# Patient Record
Sex: Male | Born: 1950 | Race: Black or African American | Hispanic: No | Marital: Married | Smoking: Former smoker
Health system: Southern US, Community
[De-identification: ages and names within clinical notes are randomized; demographics above are authoritative.]

## PROBLEM LIST (undated history)

## (undated) DIAGNOSIS — Z973 Presence of spectacles and contact lenses: Secondary | ICD-10-CM

## (undated) DIAGNOSIS — R972 Elevated prostate specific antigen [PSA]: Secondary | ICD-10-CM

## (undated) DIAGNOSIS — I10 Essential (primary) hypertension: Secondary | ICD-10-CM

## (undated) DIAGNOSIS — E119 Type 2 diabetes mellitus without complications: Secondary | ICD-10-CM

## (undated) DIAGNOSIS — M1990 Unspecified osteoarthritis, unspecified site: Secondary | ICD-10-CM

## (undated) DIAGNOSIS — C61 Malignant neoplasm of prostate: Secondary | ICD-10-CM

## (undated) DIAGNOSIS — N471 Phimosis: Secondary | ICD-10-CM

## (undated) DIAGNOSIS — G4730 Sleep apnea, unspecified: Secondary | ICD-10-CM

## (undated) HISTORY — PX: CYST EXCISION: SHX5701

---

## 2002-05-16 DIAGNOSIS — G4730 Sleep apnea, unspecified: Secondary | ICD-10-CM

## 2002-05-16 HISTORY — DX: Sleep apnea, unspecified: G47.30

## 2013-06-22 ENCOUNTER — Encounter (HOSPITAL_COMMUNITY): Payer: Self-pay | Admitting: Emergency Medicine

## 2013-06-22 ENCOUNTER — Emergency Department (HOSPITAL_COMMUNITY)
Admission: EM | Admit: 2013-06-22 | Discharge: 2013-06-22 | Disposition: A | Discharge status: Home / Self Care | Payer: BC Managed Care – PPO | Attending: Emergency Medicine | Admitting: Emergency Medicine

## 2013-06-22 ENCOUNTER — Emergency Department (HOSPITAL_COMMUNITY): Payer: BC Managed Care – PPO

## 2013-06-22 DIAGNOSIS — B9689 Other specified bacterial agents as the cause of diseases classified elsewhere: Secondary | ICD-10-CM | POA: Insufficient documentation

## 2013-06-22 DIAGNOSIS — A499 Bacterial infection, unspecified: Secondary | ICD-10-CM | POA: Insufficient documentation

## 2013-06-22 DIAGNOSIS — N454 Abscess of epididymis or testis: Secondary | ICD-10-CM | POA: Insufficient documentation

## 2013-06-22 DIAGNOSIS — E119 Type 2 diabetes mellitus without complications: Secondary | ICD-10-CM | POA: Insufficient documentation

## 2013-06-22 DIAGNOSIS — N451 Epididymitis: Secondary | ICD-10-CM

## 2013-06-22 DIAGNOSIS — F17200 Nicotine dependence, unspecified, uncomplicated: Secondary | ICD-10-CM | POA: Insufficient documentation

## 2013-06-22 DIAGNOSIS — I10 Essential (primary) hypertension: Secondary | ICD-10-CM | POA: Insufficient documentation

## 2013-06-22 HISTORY — DX: Type 2 diabetes mellitus without complications: E11.9

## 2013-06-22 HISTORY — DX: Essential (primary) hypertension: I10

## 2013-06-22 LAB — URINALYSIS, ROUTINE W REFLEX MICROSCOPIC
Bilirubin Urine: NEGATIVE
GLUCOSE, UA: NEGATIVE mg/dL
KETONES UR: NEGATIVE mg/dL
Nitrite: POSITIVE — AB
PROTEIN: NEGATIVE mg/dL
Specific Gravity, Urine: 1.023 (ref 1.005–1.030)
UROBILINOGEN UA: 1 mg/dL (ref 0.0–1.0)
pH: 5.5 (ref 5.0–8.0)

## 2013-06-22 LAB — URINE MICROSCOPIC-ADD ON

## 2013-06-22 MED ORDER — HYDROCODONE-ACETAMINOPHEN 5-325 MG PO TABS: 1.0000 | ORAL_TABLET | Freq: Four times a day (QID) | ORAL | Status: DC | PRN

## 2013-06-22 MED ORDER — SULFAMETHOXAZOLE-TRIMETHOPRIM 800-160 MG PO TABS: 1.0000 | ORAL_TABLET | Freq: Two times a day (BID) | ORAL | Status: DC

## 2013-06-22 NOTE — ED Notes (Signed)
Pt returned to room from radiology

## 2013-06-22 NOTE — ED Notes (Signed)
Pt in c/o left testicle swelling since December, states he recently moved to area and started a new job and today is the first time he has been able to come get it checked out, denies redness, c/o tenderness to palpation, denies urinary symptoms or penile discharge, states symptoms have been consistent but pain has gotten worse recently.

## 2013-06-22 NOTE — ED Notes (Signed)
Dr at bedside to update patient.

## 2013-06-22 NOTE — Discharge Instructions (Signed)
Epididymitis  Epididymitis is a swelling (inflammation) of the epididymis. The epididymis is a cord-like structure along the back part of the testicle. Epididymitis is usually, but not always, caused by infection. This is usually a sudden problem beginning with chills, fever and pain behind the scrotum and in the testicle. There may be swelling and redness of the testicle.  DIAGNOSIS   Physical examination will reveal a tender, swollen epididymis. Sometimes, cultures are obtained from the urine or from prostate secretions to help find out if there is an infection or if the cause is a different problem. Sometimes, blood work is performed to see if your white blood cell count is elevated and if a germ (bacterial) or viral infection is present. Using this knowledge, an appropriate medicine which kills germs (antibiotic) can be chosen by your caregiver. A viral infection causing epididymitis will most often go away (resolve) without treatment.  HOME CARE INSTRUCTIONS   · Hot sitz baths for 20 minutes, 4 times per day, may help relieve pain.  · Only take over-the-counter or prescription medicines for pain, discomfort or fever as directed by your caregiver.  · Take all medicines, including antibiotics, as directed. Take the antibiotics for the full prescribed length of time even if you are feeling better.  · It is very important to keep all follow-up appointments.  SEEK IMMEDIATE MEDICAL CARE IF:   · You have a fever.  · You have pain not relieved with medicines.  · You have any worsening of your problems.  · Your pain seems to come and go.  · You develop pain, redness, and swelling in the scrotum and surrounding areas.  MAKE SURE YOU:   · Understand these instructions.  · Will watch your condition.  · Will get help right away if you are not doing well or get worse.  Document Released: 04/29/2000 Document Revised: 07/25/2011 Document Reviewed: 03/19/2009  Exit[employer]® Patient Information ©2014 [employer].

## 2013-06-22 NOTE — ED Provider Notes (Signed)
CSN: [id number]     Arrival date & time 06/22/13  1148 History   First MD Initiated Contact with Patient 06/22/13 1152     Chief Complaint  Patient presents with  . Groin Swelling   (Consider location/radiation/quality/duration/timing/severity/associated sxs/prior Treatment) HPI Comments: L testicle pain and swelling, worsening, over past month. No trauma.  Patient is a 63 y.o. male presenting with testicular pain. The history is provided by the patient.  Testicle Pain This is a new problem. The current episode started more than 1 week ago. The problem occurs constantly. The problem has been gradually worsening. Pertinent negatives include no chest pain, no abdominal pain, no headaches and no shortness of breath. Nothing aggravates the symptoms. Nothing relieves the symptoms.    Past Medical History  Diagnosis Date  . Diabetes mellitus without complication   . Hypertension    History reviewed. No pertinent past surgical history. History reviewed. No pertinent family history. History  Substance Use Topics  . Smoking status: Current Every Day Smoker  . Smokeless tobacco: Not on file  . Alcohol Use: Not on file    Review of Systems  Constitutional: Negative for fever and chills.  Respiratory: Negative for shortness of breath.   Cardiovascular: Negative for chest pain.  Gastrointestinal: Negative for abdominal pain.  Genitourinary: Positive for testicular pain. Negative for dysuria, discharge, penile swelling, scrotal swelling, difficulty urinating and penile pain.  Neurological: Negative for headaches.  All other systems reviewed and are negative.    Allergies  Review of patient's allergies indicates no known allergies.  Home Medications  No current outpatient prescriptions on file. BP 156/87  Pulse 91  Temp(Src) 98.7 F (37.1 C) (Oral)  Resp 20  Wt 229 lb (103.874 kg)  SpO2 100% Physical Exam  Nursing note and vitals reviewed. Constitutional: He is oriented to  person, place, and time. He appears well-developed and well-nourished. No distress.  HENT:  Head: Normocephalic and atraumatic.  Mouth/Throat: No oropharyngeal exudate.  Eyes: EOM are normal. Pupils are equal, round, and reactive to light.  Neck: Normal range of motion. Neck supple.  Cardiovascular: Normal rate and regular rhythm.  Exam reveals no friction rub.   No murmur heard. Pulmonary/Chest: Effort normal and breath sounds normal. No respiratory distress. He has no wheezes. He has no rales.  Abdominal: He exhibits no distension. There is no tenderness. There is no rebound.  Genitourinary: Penis normal. Right testis shows no mass, no swelling and no tenderness. Right testis is descended. Cremasteric reflex is not absent on the right side. Left testis shows mass, swelling and tenderness (mild). Left testis is descended. Cremasteric reflex is absent on the left side. Uncircumcised. No penile erythema. No discharge found.  L testicle swollen, irregular contours. Swollen and tender.  Musculoskeletal: Normal range of motion. He exhibits no edema.  Neurological: He is alert and oriented to person, place, and time. No cranial nerve deficit. He exhibits normal muscle tone.  Skin: No rash noted. He is not diaphoretic.    ED Course  Procedures (including critical care time) Labs Review Labs Reviewed - No data to display Imaging Review No results found.  EKG Interpretation   None       MDM   1. Left epididymitis   2. Epididymal abscess    55M with one month of worsening L testicular swelling. Aching daily. Constant swelling, not intermittent. No dysuria, difficulty with urinating. No difficulty with bowel movements. Hx of DM, HTN. AFVSS here. L testicle hard, irregular contours. No hernia  appreciated. Concern for possible mass. Will [place]. I spoke with Radiology, patient has L sided epididymitis and likely epididymal abscess. Urology recommended 1 month course of antibiotics and f/u with  them in 1-2 weeks. Stable for discharge.  I have reviewed all labs and imaging and considered them in my medical decision making.   Osvaldo Shipper, MD 06/22/13 [phone]

## 2013-06-24 LAB — URINE CULTURE: Colony Count: >=100000

## 2014-06-23 ENCOUNTER — Other Ambulatory Visit: Payer: Self-pay | Admitting: Family Medicine

## 2014-06-23 ENCOUNTER — Ambulatory Visit
Admission: RE | Admit: 2014-06-23 | Discharge: 2014-06-23 | Disposition: A | Discharge status: Home / Self Care | Payer: BLUE CROSS/BLUE SHIELD | Source: Ambulatory Visit | Attending: Family Medicine | Admitting: Family Medicine

## 2014-06-23 DIAGNOSIS — N5089 Other specified disorders of the male genital organs: Secondary | ICD-10-CM

## 2014-06-23 DIAGNOSIS — N50812 Left testicular pain: Secondary | ICD-10-CM

## 2015-08-07 ENCOUNTER — Other Ambulatory Visit: Payer: Self-pay | Admitting: Urology

## 2015-08-14 NOTE — Anesthesia Preprocedure Evaluation (Addendum)
Anesthesia Evaluation  Patient identified by MRN, date of birth, ID band Patient awake    Reviewed: Allergy & Precautions, NPO status , Patient's Chart, lab work & pertinent test results  Airway Mallampati: II   Neck ROM: Full    Dental  (+) Teeth Intact, Dental Advisory Given   Pulmonary neg pulmonary ROS, Current Smoker, former smoker,    breath sounds clear to auscultation       Cardiovascular hypertension, Pt. on medications negative cardio ROS   Rhythm:Regular  EKG 08/18/2015 WNL   Neuro/Psych negative neurological ROS  negative psych ROS   GI/Hepatic negative GI ROS, Neg liver ROS,   Endo/Other  negative endocrine ROSdiabetes, Type 2, Oral Hypoglycemic Agents  Renal/GU negative Renal ROS  negative genitourinary   Musculoskeletal negative musculoskeletal ROS (+)   Abdominal   Peds negative pediatric ROS (+)  Hematology negative hematology ROS (+)   Anesthesia Other Findings   Reproductive/Obstetrics negative OB ROS                           Anesthesia Physical Anesthesia Plan  ASA: II  Anesthesia Plan: General   Post-op Pain Management:    Induction: Intravenous  Airway Management Planned: LMA and Oral ETT  Additional Equipment:   Intra-op Plan:   Post-operative Plan: Extubation in OR  Informed Consent: I have reviewed the patients History and Physical, chart, labs and discussed the procedure including the risks, benefits and alternatives for the proposed anesthesia with the patient or authorized representative who has indicated his/her understanding and acceptance.     Plan Discussed with:   Anesthesia Plan Comments:         Anesthesia Quick Evaluation

## 2015-08-17 ENCOUNTER — Encounter (HOSPITAL_BASED_OUTPATIENT_CLINIC_OR_DEPARTMENT_OTHER): Payer: Self-pay | Admitting: *Deleted

## 2015-08-17 NOTE — H&P (Signed)
Active Problems Problems  1. Elevated prostate specific antigen (PSA) (R97.20) 2. Glycosuria (R81) 3. Phimosis (N47.1) 4. Pyuria (N39.0)  History of Present Illness Phillip Holloway returns today in f/u. He was seen in February of last year with a scrotal abscess that was drained. He had an e.coli UTI in February as well with epididymitis. He had a negative prostate biopsy about 3-4 years ago that was complicated by prostatitis.  He had a PSA of 5.31 in May but is just now getting in to see [place]. He reports increased urinary frequency and he can have some burning related to a tight foreskin but no dysuria. He has had no hematuria.  He has a good stream without hesitancy.   Past Medical History Problems  1. History of Epididymitis, left (N45.1) 2. History of acute prostatitis (Z87.438) 3. History of diabetes mellitus (Z86.39) 4. History of epididymitis (Z87.438) 5. History of hypertension (Z86.79) 6. History of Scrotal abscess (N49.2)  Surgical History Problems  1. History of Needle Biopsy Of Prostate 2. History of Surgery Scrotum Drainage Of Scrotal Wall Abscess 3. History of Wrist Incision  Current Meds 1. Aspirin 81 MG Oral Tablet Chewable;  Therapy: (Recorded:09Feb2016) to Recorded 2. Atorvastatin Calcium 40 MG Oral Tablet;  Therapy: (Recorded:09Feb2016) to Recorded 3. Benicar HCT 40-25 MG Oral Tablet;  Therapy: (Recorded:09Feb2016) to Recorded 4. Janumet TABS;  Therapy: (G1322077) to Recorded 5. Tyler Aas FlexTouch 100 UNIT/ML Subcutaneous Solution Pen-injector;  Therapy: ([id number]) to Recorded 6. Vitamin D TABS;  Therapy: (Recorded:09Feb2016) to Recorded  Allergies Medication  1. No Known Drug Allergies  Family History Problems  1. Family history of cardiac disorder (Z82.49) : Grandmother 2. Family history of diabetes mellitus (Z83.3) : Father 3. Family history of stroke (Z82.3) : Father  Social History Problems  1. Caffeine use (F15.90)   1 cup  per day 2. Former smoker [phone]) 3. Married 4. No alcohol use 5. Number of children   2 daughters 6. Occupation   Insurance underwriter 7. Recently stopped smoking  Review of Systems  Genitourinary: no hematuria.  Gastrointestinal: no flank pain.  Constitutional: recent 30 lb weight loss, but no fever.    Vitals Vital Signs [Data Includes: Last 1 Day]  Recorded: [id number] 03:48PM  Blood Pressure: 131 / 87 Temperature: 97.3 F Heart Rate: 90  Physical Exam Constitutional: Well nourished and well developed . No acute distress.  Pulmonary: No respiratory distress and normal respiratory rhythm and effort.  Cardiovascular: Heart rate and rhythm are normal . No peripheral edema.  Abdomen: The abdomen is mildly obese. The abdomen is soft and nontender. No hernias are palpable.  Rectal: Rectal exam demonstrates normal sphincter tone, no tenderness and no masses. Estimated prostate size is 3+. The prostate has no nodularity, is not indurated and is not tender. The left seminal vesicle is nonpalpable. The right seminal vesicle is nonpalpable. The perineum is normal on inspection.  Genitourinary: Examination of the penis demonstrates phimosis and balanitis, but no discharge, no masses, no lesions and a normal meatus. The penis is uncircumcised. The scrotum is without lesions. The right epididymis is palpably normal and non-tender. The left epididymis is non-tender and is not enlarged. The right testis is palpably normal, non-tender and without masses. The left testis is normal, non-tender and without masses.  Lymphatics: The femoral and inguinal nodes are not enlarged or tender.    Results/Data Urine [Data Includes: Last 1 Day]   [id number]  COLOR YELLOW   APPEARANCE CLOUDY   SPECIFIC GRAVITY 1.015   pH  5.0   GLUCOSE TRACE   BILIRUBIN NEGATIVE   KETONE NEGATIVE   BLOOD TRACE   PROTEIN NEGATIVE   NITRITE NEGATIVE   LEUKOCYTE ESTERASE 1+   SQUAMOUS EPITHELIAL/HPF 0-5 HPF  WBC 20-40  WBC/HPF  RBC 3-10 RBC/HPF  BACTERIA NONE SEEN HPF  CRYSTALS NONE SEEN HPF  CASTS NONE SEEN LPF  Yeast NONE SEEN HPF   Assessment Assessed  1. Elevated prostate specific antigen (PSA) (R97.20) 2. Phimosis (N47.1) 3. Pyuria (N39.0)  He has marked symptomatic phimosis and has some issues with pain with intercourse and ejaculatory difficulty.   His PSA was elevated in May and he had a UTI in February.  His exam was benign.   UA today has pyuria and microhematuria that is probably from the phimosis as there is no bacteria but a culture is indicated.   Plan Elevated prostate specific antigen (PSA)  1. PSA REFLEX TO FREE; Status:Hold For - Specimen/Data Collection,Appointment;  Requested [phone];  Health Maintenance  2. UA With REFLEX; [Do Not Release]; Status:Resulted - Requires Verification;   [id number] 03:39PM Phimosis  3. Follow-up Schedule Surgery Office  Follow-up  Status: Hold For - Appointment   Requested for: [id number] Pyuria  4. URINE CULTURE; Status:Hold For - Specimen/Data Collection,Appointment; Requested  [id number];   Urine culture and PSA today.  I discussed circumcision including the risks of bleeding, infection, scarring, redundant or insufficient residual skin, change in sexual sensation, penile injury, thrombotic events and anesthetic complications.   If the culture is negative and the PSA remains elevated, I will contact him about adding a prostate biopsy.   His repeat PSA was 6.53 and he had a repeat culture on 3/31 that was positive and he was started on bactrim ds in preparation for the biopsy and circumcision.

## 2015-08-17 NOTE — Progress Notes (Signed)
Pt informed npo pmn tonight.  No meds in am.  To [facility] 3/4 @ 0845.  Needs Istat8 , ? ekg on arrival.

## 2015-08-18 ENCOUNTER — Ambulatory Visit (HOSPITAL_BASED_OUTPATIENT_CLINIC_OR_DEPARTMENT_OTHER): Payer: BLUE CROSS/BLUE SHIELD | Admitting: Anesthesiology

## 2015-08-18 ENCOUNTER — Ambulatory Visit (HOSPITAL_COMMUNITY): Payer: BLUE CROSS/BLUE SHIELD

## 2015-08-18 ENCOUNTER — Encounter (HOSPITAL_BASED_OUTPATIENT_CLINIC_OR_DEPARTMENT_OTHER): Payer: Self-pay | Admitting: Anesthesiology

## 2015-08-18 ENCOUNTER — Ambulatory Visit (HOSPITAL_BASED_OUTPATIENT_CLINIC_OR_DEPARTMENT_OTHER)
Admission: RE | Admit: 2015-08-18 | Discharge: 2015-08-18 | Disposition: A | Discharge status: Home / Self Care | Payer: BLUE CROSS/BLUE SHIELD | Source: Ambulatory Visit | Attending: Urology | Admitting: Urology

## 2015-08-18 ENCOUNTER — Other Ambulatory Visit: Payer: Self-pay

## 2015-08-18 ENCOUNTER — Encounter (HOSPITAL_BASED_OUTPATIENT_CLINIC_OR_DEPARTMENT_OTHER)
Admission: RE | Disposition: A | Discharge status: Home / Self Care | Payer: Self-pay | Source: Ambulatory Visit | Attending: Urology

## 2015-08-18 DIAGNOSIS — I10 Essential (primary) hypertension: Secondary | ICD-10-CM | POA: Insufficient documentation

## 2015-08-18 DIAGNOSIS — Z09 Encounter for follow-up examination after completed treatment for conditions other than malignant neoplasm: Secondary | ICD-10-CM | POA: Diagnosis present

## 2015-08-18 DIAGNOSIS — L821 Other seborrheic keratosis: Secondary | ICD-10-CM | POA: Insufficient documentation

## 2015-08-18 DIAGNOSIS — Z7984 Long term (current) use of oral hypoglycemic drugs: Secondary | ICD-10-CM | POA: Diagnosis not present

## 2015-08-18 DIAGNOSIS — N419 Inflammatory disease of prostate, unspecified: Secondary | ICD-10-CM | POA: Diagnosis not present

## 2015-08-18 DIAGNOSIS — N471 Phimosis: Secondary | ICD-10-CM | POA: Diagnosis not present

## 2015-08-18 DIAGNOSIS — E119 Type 2 diabetes mellitus without complications: Secondary | ICD-10-CM | POA: Insufficient documentation

## 2015-08-18 DIAGNOSIS — N390 Urinary tract infection, site not specified: Secondary | ICD-10-CM | POA: Insufficient documentation

## 2015-08-18 DIAGNOSIS — F17200 Nicotine dependence, unspecified, uncomplicated: Secondary | ICD-10-CM | POA: Insufficient documentation

## 2015-08-18 DIAGNOSIS — N5312 Painful ejaculation: Secondary | ICD-10-CM | POA: Diagnosis not present

## 2015-08-18 DIAGNOSIS — R972 Elevated prostate specific antigen [PSA]: Secondary | ICD-10-CM | POA: Diagnosis not present

## 2015-08-18 HISTORY — PX: CIRCUMCISION: SHX1350

## 2015-08-18 HISTORY — DX: Phimosis: N47.1

## 2015-08-18 HISTORY — DX: Presence of spectacles and contact lenses: Z97.3

## 2015-08-18 HISTORY — DX: Elevated prostate specific antigen (PSA): R97.20

## 2015-08-18 HISTORY — DX: Sleep apnea, unspecified: G47.30

## 2015-08-18 HISTORY — PX: PROSTATE BIOPSY: SHX241

## 2015-08-18 LAB — POCT I-STAT, CHEM 8
BUN: 16 mg/dL (ref 6–20)
CALCIUM ION: 1.25 mmol/L (ref 1.13–1.30)
CREATININE: 1.3 mg/dL — AB (ref 0.61–1.24)
Chloride: 104 mmol/L (ref 101–111)
Glucose, Bld: 148 mg/dL — ABNORMAL HIGH (ref 65–99)
HCT: 39 % (ref 39.0–52.0)
Hemoglobin: 13.3 g/dL (ref 13.0–17.0)
Potassium: 4 mmol/L (ref 3.5–5.1)
Sodium: 142 mmol/L (ref 135–145)
TCO2: 24 mmol/L (ref 0–100)

## 2015-08-18 LAB — GLUCOSE, CAPILLARY: GLUCOSE-CAPILLARY: 137 mg/dL — AB (ref 65–99)

## 2015-08-18 SURGERY — CIRCUMCISION, ADULT
Anesthesia: General | Site: Prostate

## 2015-08-18 MED ORDER — CEFTRIAXONE SODIUM 2 G IJ SOLR
INTRAMUSCULAR | Status: AC
  Filled 2015-08-18: qty 2

## 2015-08-18 MED ORDER — DEXTROSE 5 % IV SOLN
2.0000 g | Freq: Once | INTRAVENOUS | Status: AC
  Administered 2015-08-18: 2 g via INTRAVENOUS
  Filled 2015-08-18: qty 2

## 2015-08-18 MED ORDER — PROPOFOL 10 MG/ML IV BOLUS
INTRAVENOUS | Status: AC
  Filled 2015-08-18: qty 40

## 2015-08-18 MED ORDER — LACTATED RINGERS IV SOLN
INTRAVENOUS | Status: DC
  Administered 2015-08-18 (×2): via INTRAVENOUS
  Filled 2015-08-18: qty 1000

## 2015-08-18 MED ORDER — MEPERIDINE HCL 25 MG/ML IJ SOLN
6.2500 mg | INTRAMUSCULAR | Status: DC | PRN
  Filled 2015-08-18: qty 1

## 2015-08-18 MED ORDER — GENTAMICIN SULFATE 40 MG/ML IJ SOLN
5.0000 mg/kg | INTRAVENOUS | Status: AC
  Administered 2015-08-18: 430 mg via INTRAVENOUS
  Filled 2015-08-18 (×2): qty 10.75

## 2015-08-18 MED ORDER — MIDAZOLAM HCL 5 MG/5ML IJ SOLN
INTRAMUSCULAR | Status: DC | PRN
  Administered 2015-08-18: 2 mg via INTRAVENOUS

## 2015-08-18 MED ORDER — LIDOCAINE HCL (CARDIAC) 20 MG/ML IV SOLN
INTRAVENOUS | Status: DC | PRN
  Administered 2015-08-18: 100 mg via INTRAVENOUS

## 2015-08-18 MED ORDER — MIDAZOLAM HCL 2 MG/2ML IJ SOLN
INTRAMUSCULAR | Status: AC
  Filled 2015-08-18: qty 2

## 2015-08-18 MED ORDER — FENTANYL CITRATE (PF) 100 MCG/2ML IJ SOLN
INTRAMUSCULAR | Status: AC
  Filled 2015-08-18: qty 2

## 2015-08-18 MED ORDER — FENTANYL CITRATE (PF) 100 MCG/2ML IJ SOLN
25.0000 ug | INTRAMUSCULAR | Status: DC | PRN
  Filled 2015-08-18: qty 1

## 2015-08-18 MED ORDER — DEXAMETHASONE SODIUM PHOSPHATE 4 MG/ML IJ SOLN
INTRAMUSCULAR | Status: DC | PRN
  Administered 2015-08-18: 5 mg via INTRAVENOUS

## 2015-08-18 MED ORDER — PROMETHAZINE HCL 25 MG/ML IJ SOLN
6.2500 mg | INTRAMUSCULAR | Status: DC | PRN
  Filled 2015-08-18: qty 1

## 2015-08-18 MED ORDER — BUPIVACAINE HCL (PF) 0.25 % IJ SOLN
INTRAMUSCULAR | Status: DC | PRN
  Administered 2015-08-18: 8 mL

## 2015-08-18 MED ORDER — PROPOFOL 10 MG/ML IV BOLUS
INTRAVENOUS | Status: DC | PRN
  Administered 2015-08-18: 200 mg via INTRAVENOUS

## 2015-08-18 MED ORDER — DEXTROSE 5 % IV SOLN
INTRAVENOUS | Status: AC
  Filled 2015-08-18: qty 50

## 2015-08-18 MED ORDER — ONDANSETRON HCL 4 MG/2ML IJ SOLN
INTRAMUSCULAR | Status: DC | PRN
  Administered 2015-08-18: 4 mg via INTRAVENOUS

## 2015-08-18 MED ORDER — HYDROCODONE-ACETAMINOPHEN 5-325 MG PO TABS
1.0000 | ORAL_TABLET | Freq: Four times a day (QID) | ORAL | Status: DC | PRN
Start: 2015-08-18 — End: 2017-01-04

## 2015-08-18 MED ORDER — FENTANYL CITRATE (PF) 100 MCG/2ML IJ SOLN
INTRAMUSCULAR | Status: DC | PRN
  Administered 2015-08-18 (×3): 25 ug via INTRAVENOUS
  Administered 2015-08-18: 100 ug via INTRAVENOUS
  Administered 2015-08-18: 25 ug via INTRAVENOUS

## 2015-08-18 SURGICAL SUPPLY — 29 items
BANDAGE COBAN STERILE 2 (GAUZE/BANDAGES/DRESSINGS) ×4 IMPLANT
BLADE SURG 15 STRL LF DISP TIS (BLADE) ×2 IMPLANT
BLADE SURG 15 STRL SS (BLADE) ×2
BNDG CONFORM 2 STRL LF (GAUZE/BANDAGES/DRESSINGS) ×4 IMPLANT
COVER BACK TABLE 60X90IN (DRAPES) ×4 IMPLANT
COVER MAYO STAND STRL (DRAPES) ×4 IMPLANT
DRAPE LAPAROTOMY 100X72 PEDS (DRAPES) ×4 IMPLANT
ELECT REM PT RETURN 9FT ADLT (ELECTROSURGICAL) ×4
ELECTRODE REM PT RTRN 9FT ADLT (ELECTROSURGICAL) ×2 IMPLANT
GAUZE XEROFORM 1X8 LF (GAUZE/BANDAGES/DRESSINGS) ×4 IMPLANT
GLOVE SURG SS PI 8.0 STRL IVOR (GLOVE) ×4 IMPLANT
GOWN STRL REUS W/ TWL LRG LVL3 (GOWN DISPOSABLE) ×4 IMPLANT
GOWN STRL REUS W/ TWL XL LVL3 (GOWN DISPOSABLE) ×2 IMPLANT
GOWN STRL REUS W/TWL LRG LVL3 (GOWN DISPOSABLE) ×4
GOWN STRL REUS W/TWL XL LVL3 (GOWN DISPOSABLE) ×2
INST BIOPSY MAXCORE 18GX25 (NEEDLE) ×4 IMPLANT
KIT ROOM TURNOVER WOR (KITS) ×4 IMPLANT
LIQUID BAND (GAUZE/BANDAGES/DRESSINGS) ×4 IMPLANT
NEEDLE HYPO 25X1 1.5 SAFETY (NEEDLE) ×4 IMPLANT
NS IRRIG 500ML POUR BTL (IV SOLUTION) IMPLANT
PACK BASIN DAY SURGERY FS (CUSTOM PROCEDURE TRAY) ×4 IMPLANT
PENCIL BUTTON HOLSTER BLD 10FT (ELECTRODE) ×4 IMPLANT
SPONGE GAUZE 4X4 12PLY STER LF (GAUZE/BANDAGES/DRESSINGS) ×4 IMPLANT
SURGILUBE 2OZ TUBE FLIPTOP (MISCELLANEOUS) ×4 IMPLANT
SUT CHROMIC 4 0 PS 2 18 (SUTURE) ×8 IMPLANT
SYRINGE CONTROL L 12CC (SYRINGE) ×4 IMPLANT
TOWEL OR 17X24 6PK STRL BLUE (TOWEL DISPOSABLE) ×8 IMPLANT
TRAY DSU PREP LF (CUSTOM PROCEDURE TRAY) ×4 IMPLANT
UNDERPAD 30X30 INCONTINENT (UNDERPADS AND DIAPERS) ×12 IMPLANT

## 2015-08-18 NOTE — Anesthesia Postprocedure Evaluation (Signed)
Anesthesia Post Note  Patient: Phillip Holloway  Procedure(s) Performed: Procedure(s) (LRB): CIRCUMCISION WITH BIOPSY OF PENILE NEVUS  (N/A) PROSTATE ULTRASOUND AND BIOPSY (N/A)  Patient location during evaluation: PACU Anesthesia Type: General Level of consciousness: awake and alert Pain management: pain level controlled Vital Signs Assessment: post-procedure vital signs reviewed and stable Respiratory status: spontaneous breathing, nonlabored ventilation, respiratory function stable and patient connected to nasal cannula oxygen Cardiovascular status: blood pressure returned to baseline and stable Postop Assessment: no signs of nausea or vomiting Anesthetic complications: no    Last Vitals:  Filed Vitals:   08/18/15 1132 08/18/15 1145  BP: 128/78 128/80  Pulse: 74 58  Temp: 36.4 C   Resp: 14 13    Last Pain: There were no vitals filed for this visit.               Alexis Frock

## 2015-08-18 NOTE — Transfer of Care (Signed)
Immediate Anesthesia Transfer of Care Note  Patient: Phillip Holloway  Procedure(s) Performed: Procedure(s) (LRB): CIRCUMCISION WITH BIOPSY OF PENILE NEVUS  (N/A) PROSTATE ULTRASOUND AND BIOPSY (N/A)  Patient Location: PACU  Anesthesia Type: General  Level of Consciousness: awake, sedated, patient cooperative and responds to stimulation  Airway & Oxygen Therapy: Patient Spontanous Breathing and Patient connected to face mask oxygen  Post-op Assessment: Report given to PACU RN, Post -op Vital signs reviewed and stable and Patient moving all extremities  Post vital signs: Reviewed and stable  Complications: No apparent anesthesia complications

## 2015-08-18 NOTE — Discharge Instructions (Signed)
Circumcision-Home Care Instructions  The following instructions have been prepared to help you care for yourself upon your return home today.   Wound Care & Hygiene:   You may apply ice to the penis.  This may help to decrease swelling.  Remove the dressing tomorrow.  If the dressing falls off before then, leave it off.  You may shower or bathe in 48 hours  Gently wash the penis with soap and water.  The stitches do not need to be removed.  Activity:  Do not drive or operate any equipment today.  The effects of anesthesia are still present, drowsiness may result.  Rest today, not necessarily flat bed rest, just take it easy.  You may resume your normal activity in one to two days or as indicated by your physician.  Sexual Activity:  Erection and sexual relations should be avoided for *2 weeks.  Return to Work:  One to two days or as indicated by your physician .  Diet:  Drink liquids or eat a very light diet this evening.  You may resume a regular diet tomorrow.  General Expectations of your surgery:   You may have a small amount of bleeding  The penis will be swollen and bruised for approximately one week  You may wake during the night with an erection, usually this is caused by having a full bladder so you should try to urinate (pass your water) to relieve the erection or apply ice to the penis  Unexpected Observations - Call your doctor if these occur!  Persistent or heavy bleeding  Temperature of 101 degrees or more  Severe pain not relieved by medication  Patient's Signature:__________________________________________________  Physician's Signature:___________________ Nurse's Signature_______________     Post Anesthesia Home Care Instructions  Activity: Get plenty of rest for the remainder of the day. A responsible adult should stay with you for 24 hours following the procedure.  For the next 24 hours, DO NOT: -Drive a car -Paediatric nurse -Drink alcoholic  beverages -Take any medication unless instructed by your physician -Make any legal decisions or sign important papers.  Meals: Start with liquid foods such as gelatin or soup. Progress to regular foods as tolerated. Avoid greasy, spicy, heavy foods. If nausea and/or vomiting occur, drink only clear liquids until the nausea and/or vomiting subsides. Call your physician if vomiting continues.  Special Instructions/Symptoms: Your throat may feel dry or sore from the anesthesia or the breathing tube placed in your throat during surgery. If this causes discomfort, gargle with warm salt water. The discomfort should disappear within 24 hours.  If you had a scopolamine patch placed behind your ear for the management of post- operative nausea and/or vomiting:  1. The medication in the patch is effective for 72 hours, after which it should be removed.  Wrap patch in a tissue and discard in the trash. Wash hands thoroughly with soap and water. 2. You may remove the patch earlier than 72 hours if you experience unpleasant side effects which may include dry mouth, dizziness or visual disturbances. 3. Avoid touching the patch. Wash your hands with soap and water after contact with the patch.

## 2015-08-18 NOTE — Brief Op Note (Signed)
08/18/2015  11:23 AM  PATIENT:  Phillip Holloway  65 y.o. male  PRE-OPERATIVE DIAGNOSIS:  PHIMOSIS ELEVATED PSA      POST-OPERATIVE DIAGNOSIS:  PHIMOSIS, ELEVATED PSA,  ABNORMAL NEVUS.     PROCEDURE:  Procedure(s): CIRCUMCISION  (N/A) PROSTATE ULTRASOUND AND BIOPSY (N/A)  PENILE SKIN BIOPSY  SURGEON:  Surgeon(s) and Role:    * Irine Seal, MD - Primary  PHYSICIAN ASSISTANT:   ASSISTANTS: none   ANESTHESIA:   general  EBL:  Total I/O In: 1000 [I.V.:1000] Out: -   BLOOD ADMINISTERED:none  DRAINS: none   LOCAL MEDICATIONS USED:  MARCAINE     SPECIMEN:  Source of Specimen:  prostate biopsies and nevus  DISPOSITION OF SPECIMEN:  PATHOLOGY  COUNTS:  YES  TOURNIQUET:  * No tourniquets in log *  DICTATION: .Other Dictation: Dictation Number [id number]  PLAN OF CARE: Discharge to home after PACU  PATIENT DISPOSITION:  PACU - hemodynamically stable.   Delay start of Pharmacological VTE agent (>24hrs) due to surgical blood loss or risk of bleeding: not applicable

## 2015-08-18 NOTE — Anesthesia Procedure Notes (Signed)
Procedure Name: LMA Insertion Date/Time: 08/18/2015 10:28 AM Performed by: Justice Rocher Pre-anesthesia Checklist: Patient identified, Emergency Drugs available, Suction available and Patient being monitored Patient Re-evaluated:Patient Re-evaluated prior to inductionOxygen Delivery Method: Circle System Utilized Preoxygenation: Pre-oxygenation with 100% oxygen Intubation Type: IV induction Ventilation: Mask ventilation without difficulty LMA: LMA inserted LMA Size: 5.0 Number of attempts: 1 Airway Equipment and Method: Bite block Placement Confirmation: positive ETCO2 Tube secured with: Tape Dental Injury: Teeth and Oropharynx as per pre-operative assessment

## 2015-08-19 ENCOUNTER — Encounter (HOSPITAL_BASED_OUTPATIENT_CLINIC_OR_DEPARTMENT_OTHER): Payer: Self-pay | Admitting: Urology

## 2015-08-19 NOTE — Op Note (Signed)
NAMELOYD, EURICH           ACCOUNT NO.:  [id number]  MEDICAL RECORD NO.:  [id number]  LOCATION:                                 FACILITY:  PHYSICIAN:  Marshall Cork. Jeffie Pollock, M.D.    DATE OF BIRTH:  1951-04-14  DATE OF PROCEDURE:  08/18/2015 DATE OF DISCHARGE:                              OPERATIVE REPORT   PROCEDURE: 1. Prostate ultrasound. 2. Ultrasound-guided prostate biopsy. 3. Circumcision. 4. Biopsy of penile nevus.  PREOPERATIVE DIAGNOSIS:  Elevated PSA and phimosis.  POSTOPERATIVE DIAGNOSIS:  Elevated PSA and phimosis with abnormal penile nevus.  SURGEON:  Marshall Cork. Jeffie Pollock, MD  ANESTHESIA:  General.  SPECIMEN:  A 12-core prostate biopsy and nevus from anterior penis.  BLOOD LOSS:  5 mL.  DRAINS:  None.  COMPLICATIONS:  None.  INDICATIONS:  Mr. Phegley is a 65 year old, African American male with an elevated PSA and moderate phimosis.  He is to undergo prostate ultrasound and biopsy and circumcision.  FINDINGS OF PROCEDURE:  He was taken to the operating room where he was given Rocephin and gentamicin and general anesthetic was induced.  He was initially placed in left lateral decubitus position.  The 10 megahertz ultrasound probe was assembled and inserted and scanning was performed.  This demonstrated normal-appearing seminal vesicles.  The peripheral zone was unremarkable without hypoechoic lesions.  The transitional zone had a normal variegated echotexture, but there was a proximal 0.5 cm cystic complex in the left mid apical transitional zone. The prostate volume was 63 mL with a length of 5.78 cm, a width of 4.8 cm, and a height of 4.35 cm.  There was no significant intravesical middle lobe noted.  The seminal vesicles were unremarkable.  Once the diagnostic scan had been performed, a 12-core biopsy was obtained in the usual configuration.  There was minimal bleeding noted.  After completion of prostate ultrasound and biopsy, the patient was then rolled  back supine.  His genitalia were prepped with Betadine solution. He was draped in usual sterile fashion.  Sleeve resection of the redundant foreskin was performed and hemostasis was achieved with the Bovie.  The skin edges were reapproximated using a running 4-0 chromic locked in the quadrants.  Once the circumcision was complete, inspection of the penis demonstrated approximately a 5 mm nevus on the mid dorsum of the penis.  This has been noted previously, but there appeared to be some small satellite lesions and I was concerned that this may represent either an atypical nevus or possibly a melanoma and felt that a biopsy was indicated.  This was done using the knife.  Hemostasis was achieved with the Bovie and the wound was closed with interrupted 4-0 chromic.  The length of the wound was approximately 1 cm.  Once this had been completed, a penile block was performed with 9 mL of 0.5% Marcaine without epinephrine.  The penis was cleansed.  Dermabond was applied to the penile biopsy and a dressing of Xeroform, Kling, and Coban was applied to the circumcision.  The patient's anesthetic was reversed.  He was moved to recovery room in stable condition.  There were no complications.     Marshall Cork. Jeffie Pollock, M.D.  JJW/MEDQ  D:  08/18/2015  T:  08/19/2015  Job:  [id number]

## 2017-01-04 ENCOUNTER — Other Ambulatory Visit: Payer: Self-pay | Admitting: Orthopedic Surgery

## 2017-01-04 DIAGNOSIS — M5441 Lumbago with sciatica, right side: Secondary | ICD-10-CM

## 2017-01-12 ENCOUNTER — Ambulatory Visit
Admission: RE | Admit: 2017-01-12 | Discharge: 2017-01-12 | Disposition: A | Discharge status: Home / Self Care | Payer: Self-pay | Source: Ambulatory Visit | Attending: Orthopedic Surgery | Admitting: Orthopedic Surgery

## 2017-01-12 ENCOUNTER — Other Ambulatory Visit: Payer: Self-pay | Admitting: Orthopedic Surgery

## 2017-01-12 DIAGNOSIS — R52 Pain, unspecified: Secondary | ICD-10-CM

## 2017-01-13 ENCOUNTER — Ambulatory Visit
Admission: RE | Admit: 2017-01-13 | Discharge: 2017-01-13 | Disposition: A | Discharge status: Home / Self Care | Payer: BLUE CROSS/BLUE SHIELD | Source: Ambulatory Visit | Attending: Orthopedic Surgery | Admitting: Orthopedic Surgery

## 2017-01-13 DIAGNOSIS — M5441 Lumbago with sciatica, right side: Secondary | ICD-10-CM

## 2017-01-13 MED ORDER — IOPAMIDOL (ISOVUE-M 200) INJECTION 41%
15.0000 mL | Freq: Once | INTRAMUSCULAR | Status: AC
  Administered 2017-01-13: 15 mL via INTRATHECAL

## 2017-01-13 MED ORDER — ONDANSETRON HCL 4 MG/2ML IJ SOLN: 4.0000 mg | Freq: Four times a day (QID) | INTRAMUSCULAR | Status: DC | PRN

## 2017-01-13 MED ORDER — DIAZEPAM 5 MG PO TABS: 5.0000 mg | ORAL_TABLET | Freq: Once | ORAL | Status: DC

## 2017-01-13 NOTE — Discharge Instructions (Signed)

## 2017-01-23 ENCOUNTER — Ambulatory Visit
Admission: RE | Admit: 2017-01-23 | Discharge: 2017-01-23 | Disposition: A | Discharge status: Home / Self Care | Payer: BLUE CROSS/BLUE SHIELD | Source: Ambulatory Visit | Attending: Family Medicine | Admitting: Family Medicine

## 2017-01-23 ENCOUNTER — Other Ambulatory Visit: Payer: Self-pay | Admitting: Family Medicine

## 2017-01-23 DIAGNOSIS — Z01818 Encounter for other preprocedural examination: Secondary | ICD-10-CM

## 2017-02-16 NOTE — Patient Instructions (Signed)
Phillip Holloway  02/16/2017   Your procedure is scheduled on: 02/22/17  Report to [facility] Main  Entrance Take [place]  elevators to 3rd floor to  [place] at     [phone].    Call this number if you have problems the morning of surgery [phone]    Remember: ONLY 1 PERSON MAY GO WITH YOU TO SHORT STAY TO GET  READY MORNING OF YOUR SURGERY.  Do not eat food or drink liquids :After Midnight.     Take these medicines the morning of surgery with A SIP OF WATER: none DO NOT TAKE ANY ORAL DIABETIC MEDICATIONS DAY OF YOUR SURGERY                               You may not have any metal on your body including hair pins and              piercings  Do not wear jewelry,  lotions, powders or perfumes, deodorant                      Men may shave face and neck.   Do not bring valuables to the hospital. Phillip Holloway.  Contacts, dentures or bridgework may not be worn into surgery.  Leave suitcase in the car. After surgery it may be brought to your room.                 Please read over the following fact sheets you were given: _____________________________________________________________________          [facility] - Preparing for Surgery Before surgery, you can play an important role.  Because skin is not sterile, your skin needs to be as free of germs as possible.  You can reduce the number of germs on your skin by washing with CHG (chlorahexidine gluconate) soap before surgery.  CHG is an antiseptic cleaner which kills germs and bonds with the skin to continue killing germs even after washing. Please DO NOT use if you have an allergy to CHG or antibacterial soaps.  If your skin becomes reddened/irritated stop using the CHG and inform your nurse when you arrive at Short Stay. Do not shave (including legs and underarms) for at least 48 hours prior to the first CHG shower.  You may shave your face/neck. Please  follow these instructions carefully:  1.  Shower with CHG Soap the night before surgery and the  morning of Surgery.  2.  If you choose to wash your hair, wash your hair first as usual with your  normal  shampoo.  3.  After you shampoo, rinse your hair and body thoroughly to remove the  shampoo.                           4.  Use CHG as you would any other liquid soap.  You can apply chg directly  to the skin and wash                       Gently with a scrungie or clean washcloth.  5.  Apply the CHG Soap to your body ONLY FROM THE NECK  DOWN.   Do not use on face/ open                           Wound or open sores. Avoid contact with eyes, ears mouth and genitals (private parts).                       Wash face,  Genitals (private parts) with your normal soap.             6.  Wash thoroughly, paying special attention to the area where your surgery  will be performed.  7.  Thoroughly rinse your body with warm water from the neck down.  8.  DO NOT shower/wash with your normal soap after using and rinsing off  the CHG Soap.                9.  Pat yourself dry with a clean towel.            10.  Wear clean pajamas.            11.  Place clean sheets on your bed the night of your first shower and do not  sleep with pets. Day of Surgery : Do not apply any lotions/deodorants the morning of surgery.  Please wear clean clothes to the hospital/surgery center.  FAILURE TO FOLLOW THESE INSTRUCTIONS MAY RESULT IN THE CANCELLATION OF YOUR SURGERY PATIENT SIGNATURE_________________________________  NURSE SIGNATURE__________________________________  ________________________________________________________________________ How to Manage Your Diabetes Before and After Surgery  Why is it important to control my blood sugar before and after surgery? . Improving blood sugar levels before and after surgery helps healing and can limit problems. . A way of improving blood sugar control is eating a healthy diet  by: o  Eating less sugar and carbohydrates o  Increasing activity/exercise o  Talking with your doctor about reaching your blood sugar goals . High blood sugars (greater than 180 mg/dL) can raise your risk of infections and slow your recovery, so you will need to focus on controlling your diabetes during the weeks before surgery. . Make sure that the doctor who takes care of your diabetes knows about your planned surgery including the date and location.  How do I manage my blood sugar before surgery? . Check your blood sugar at least 4 times a day, starting 2 days before surgery, to make sure that the level is not too high or low. o Check your blood sugar the morning of your surgery when you wake up and every 2 hours until you get to the Short Stay unit. . If your blood sugar is less than 70 mg/dL, you will need to treat for low blood sugar: o Do not take insulin. o Treat a low blood sugar (less than 70 mg/dL) with  cup of clear juice (cranberry or apple), 4 glucose tablets, OR glucose gel. o Recheck blood sugar in 15 minutes after treatment (to make sure it is greater than 70 mg/dL). If your blood sugar is not greater than 70 mg/dL on recheck, call [phone] for further instructions. . Report your blood sugar to the short stay nurse when you get to Short Stay.  . If you are admitted to the hospital after surgery: o Your blood sugar will be checked by the staff and you will probably be given insulin after surgery (instead of oral diabetes medicines) to make sure you have good blood sugar levels. o The goal  for blood sugar control after surgery is 80-180 mg/dL.   WHAT DO I DO ABOUT MY DIABETES MEDICATION?  Marland Kitchen Do not take oral diabetes medicines (pills) the morning of surgery.  . THE NIGHT BEFORE SURGERY, take   0  units of       insulin.       . THE MORNING OF SURGERY, take  14 units of  Tresbia        insulin.  . The day of surgery, do not take other diabetes injectables, including  Byetta (exenatide), Bydureon (exenatide ER), Victoza (liraglutide), or Trulicity (dulaglutide).  . If your CBG is greater than 220 mg/dL, you may take  of your sliding scale  . (correction) dose of insulin.   Patient Signature:  Date:   Nurse Signature:  Date:   Reviewed and Endorsed by [facility] Patient Education Committee, August 2015 WHAT IS A BLOOD TRANSFUSION? Blood Transfusion Information  A transfusion is the replacement of blood or some of its parts. Blood is made up of multiple cells which provide different functions.  Red blood cells carry oxygen and are used for blood loss replacement.  White blood cells fight against infection.  Platelets control bleeding.  Plasma helps clot blood.  Other blood products are available for specialized needs, such as hemophilia or other clotting disorders. BEFORE THE TRANSFUSION  Who gives blood for transfusions?   Healthy volunteers who are fully evaluated to make sure their blood is safe. This is blood bank blood. Transfusion therapy is the safest it has ever been in the practice of medicine. Before blood is taken from a donor, a complete history is taken to make sure that person has no history of diseases nor engages in risky social behavior (examples are intravenous drug use or sexual activity with multiple partners). The donor's travel history is screened to minimize risk of transmitting infections, such as malaria. The donated blood is tested for signs of infectious diseases, such as HIV and hepatitis. The blood is then tested to be sure it is compatible with you in order to minimize the chance of a transfusion reaction. If you or a relative donates blood, this is often done in anticipation of surgery and is not appropriate for emergency situations. It takes many days to process the donated blood. RISKS AND COMPLICATIONS Although transfusion therapy is very safe and saves many lives, the main dangers of transfusion include:   Getting  an infectious disease.  Developing a transfusion reaction. This is an allergic reaction to something in the blood you were given. Every precaution is taken to prevent this. The decision to have a blood transfusion has been considered carefully by your caregiver before blood is given. Blood is not given unless the benefits outweigh the risks. AFTER THE TRANSFUSION  Right after receiving a blood transfusion, you will usually feel much better and more energetic. This is especially true if your red blood cells have gotten low (anemic). The transfusion raises the level of the red blood cells which carry oxygen, and this usually causes an energy increase.  The nurse administering the transfusion will monitor you carefully for complications. HOME CARE INSTRUCTIONS  No special instructions are needed after a transfusion. You may find your energy is better. Speak with your caregiver about any limitations on activity for underlying diseases you may have. SEEK MEDICAL CARE IF:   Your condition is not improving after your transfusion.  You develop redness or irritation at the intravenous (IV) site. [place]  IF:  Any of the following symptoms occur over the next 12 hours:  Shaking chills.  You have a temperature by mouth above 102 F (38.9 C), not controlled by medicine.  Chest, back, or muscle pain.  People around you feel you are not acting correctly or are confused.  Shortness of breath or difficulty breathing.  Dizziness and fainting.  You get a rash or develop hives.  You have a decrease in urine output.  Your urine turns a dark color or changes to pink, red, or brown. Any of the following symptoms occur over the next 10 days:  You have a temperature by mouth above 102 F (38.9 C), not controlled by medicine.  Shortness of breath.  Weakness after normal activity.  The white part of the eye turns yellow (jaundice).  You have a decrease in the amount of urine  or are urinating less often.  Your urine turns a dark color or changes to pink, red, or brown. Document Released: 04/29/2000 Document Revised: 07/25/2011 Document Reviewed: 12/17/2007 ExitCare Patient Information 2014 [place].  _______________________________________________________________________  Incentive Spirometer  An incentive spirometer is a tool that can help keep your lungs clear and active. This tool measures how well you are filling your lungs with each breath. Taking long deep breaths may help reverse or decrease the chance of developing breathing (pulmonary) problems (especially infection) following:  A long period of time when you are unable to move or be active. BEFORE THE PROCEDURE   If the spirometer includes an indicator to show your best effort, your nurse or respiratory therapist will set it to a desired goal.  If possible, sit up straight or lean slightly forward. Try not to slouch.  Hold the incentive spirometer in an upright position. INSTRUCTIONS FOR USE  1. Sit on the edge of your bed if possible, or sit up as far as you can in bed or on a chair. 2. Hold the incentive spirometer in an upright position. 3. Breathe out normally. 4. Place the mouthpiece in your mouth and seal your lips tightly around it. 5. Breathe in slowly and as deeply as possible, raising the piston or the ball toward the top of the column. 6. Hold your breath for 3-5 seconds or for as long as possible. Allow the piston or ball to fall to the bottom of the column. 7. Remove the mouthpiece from your mouth and breathe out normally. 8. Rest for a few seconds and repeat Steps 1 through 7 at least 10 times every 1-2 hours when you are awake. Take your time and take a few normal breaths between deep breaths. 9. The spirometer may include an indicator to show your best effort. Use the indicator as a goal to work toward during each repetition. 10. After each set of 10 deep breaths, practice  coughing to be sure your lungs are clear. If you have an incision (the cut made at the time of surgery), support your incision when coughing by placing a pillow or rolled up towels firmly against it. Once you are able to get out of bed, walk around indoors and cough well. You may stop using the incentive spirometer when instructed by your caregiver.  RISKS AND COMPLICATIONS  Take your time so you do not get dizzy or light-headed.  If you are in pain, you may need to take or ask for pain medication before doing incentive spirometry. It is harder to take a deep breath if you are having pain. AFTER USE  Rest and breathe slowly and easily.  It can be helpful to keep track of a log of your progress. Your caregiver can provide you with a simple table to help with this. If you are using the spirometer at home, follow these instructions: [place] IF:   You are having difficultly using the spirometer.  You have trouble using the spirometer as often as instructed.  Your pain medication is not giving enough relief while using the spirometer.  You develop fever of 100.5 F (38.1 C) or higher. SEEK IMMEDIATE MEDICAL CARE IF:   You cough up bloody sputum that had not been present before.  You develop fever of 102 F (38.9 C) or greater.  You develop worsening pain at or near the incision site. MAKE SURE YOU:   Understand these instructions.  Will watch your condition.  Will get help right away if you are not doing well or get worse. Document Released: 09/12/2006 Document Revised: 07/25/2011 Document Reviewed: 11/13/2006 [facility] Patient Information 2014 [place], [place].   ________________________________________________________________________

## 2017-02-17 ENCOUNTER — Ambulatory Visit (HOSPITAL_COMMUNITY)
Admission: RE | Admit: 2017-02-17 | Discharge: 2017-02-17 | Disposition: A | Discharge status: Home / Self Care | Payer: BLUE CROSS/BLUE SHIELD | Source: Ambulatory Visit | Attending: Surgical | Admitting: Surgical

## 2017-02-17 ENCOUNTER — Encounter (HOSPITAL_COMMUNITY)
Admission: RE | Admit: 2017-02-17 | Discharge: 2017-02-17 | Disposition: A | Discharge status: Home / Self Care | Payer: BLUE CROSS/BLUE SHIELD | Source: Ambulatory Visit | Attending: Orthopedic Surgery | Admitting: Orthopedic Surgery

## 2017-02-17 ENCOUNTER — Encounter (HOSPITAL_COMMUNITY): Payer: Self-pay

## 2017-02-17 DIAGNOSIS — M5136 Other intervertebral disc degeneration, lumbar region: Secondary | ICD-10-CM | POA: Diagnosis not present

## 2017-02-17 DIAGNOSIS — M545 Low back pain, unspecified: Secondary | ICD-10-CM

## 2017-02-17 HISTORY — DX: Unspecified osteoarthritis, unspecified site: M19.90

## 2017-02-17 LAB — BASIC METABOLIC PANEL
Anion gap: 9 (ref 5–15)
BUN: 25 mg/dL — ABNORMAL HIGH (ref 6–20)
CHLORIDE: 104 mmol/L (ref 101–111)
CO2: 28 mmol/L (ref 22–32)
CREATININE: 1.42 mg/dL — AB (ref 0.61–1.24)
Calcium: 9.8 mg/dL (ref 8.9–10.3)
GFR calc non Af Amer: 51 mL/min — ABNORMAL LOW (ref >60)
GFR, EST AFRICAN AMERICAN: 59 mL/min — AB (ref >60)
Glucose, Bld: 261 mg/dL — ABNORMAL HIGH (ref 65–99)
POTASSIUM: 4.4 mmol/L (ref 3.5–5.1)
SODIUM: 141 mmol/L (ref 135–145)

## 2017-02-17 LAB — CBC
HCT: 40.8 % (ref 39.0–52.0)
Hemoglobin: 14.3 g/dL (ref 13.0–17.0)
MCH: 30.1 pg (ref 26.0–34.0)
MCHC: 35 g/dL (ref 30.0–36.0)
MCV: 85.9 fL (ref 78.0–100.0)
PLATELETS: 188 10*3/uL (ref 150–400)
RBC: 4.75 MIL/uL (ref 4.22–5.81)
RDW: 12.8 % (ref 11.5–15.5)
WBC: 10.7 10*3/uL — AB (ref 4.0–10.5)

## 2017-02-17 LAB — SURGICAL PCR SCREEN
MRSA, PCR: NEGATIVE
Staphylococcus aureus: NEGATIVE

## 2017-02-17 LAB — GLUCOSE, CAPILLARY: GLUCOSE-CAPILLARY: 265 mg/dL — AB (ref 65–99)

## 2017-02-17 LAB — TYPE AND SCREEN
ABO/RH(D): A POS
Antibody Screen: NEGATIVE

## 2017-02-17 LAB — HEMOGLOBIN A1C
Hgb A1c MFr Bld: 9.5 % — ABNORMAL HIGH (ref 4.8–5.6)
MEAN PLASMA GLUCOSE: 225.95 mg/dL

## 2017-02-17 NOTE — Progress Notes (Signed)
Clearance from Dr. Buddy Duty 09/22/16, and from Dr. Dorthy Cooler 01/23/17 both on chart EKG 01/23/17 chart  CXR 01/23/17 chart

## 2017-02-17 NOTE — Progress Notes (Signed)
hgba1c and BMP done 02/17/17 routed to Dr. Gladstone Lighter via epic

## 2017-02-22 ENCOUNTER — Encounter (HOSPITAL_COMMUNITY): Payer: Self-pay | Admitting: Orthopedic Surgery

## 2017-02-22 ENCOUNTER — Ambulatory Visit (HOSPITAL_COMMUNITY): Payer: BLUE CROSS/BLUE SHIELD | Admitting: Anesthesiology

## 2017-02-22 ENCOUNTER — Ambulatory Visit (HOSPITAL_COMMUNITY): Payer: BLUE CROSS/BLUE SHIELD

## 2017-02-22 ENCOUNTER — Encounter (HOSPITAL_COMMUNITY)
Admission: AD | Disposition: A | Discharge status: Home / Self Care | Payer: Self-pay | Source: Ambulatory Visit | Attending: Orthopedic Surgery

## 2017-02-22 ENCOUNTER — Inpatient Hospital Stay (HOSPITAL_COMMUNITY)
Admission: AD | Admit: 2017-02-22 | Discharge: 2017-02-24 | DRG: 517 | Disposition: A | Discharge status: Home / Self Care | Payer: BLUE CROSS/BLUE SHIELD | Source: Ambulatory Visit | Attending: Orthopedic Surgery | Admitting: Orthopedic Surgery

## 2017-02-22 DIAGNOSIS — M21371 Foot drop, right foot: Secondary | ICD-10-CM | POA: Diagnosis present

## 2017-02-22 DIAGNOSIS — R972 Elevated prostate specific antigen [PSA]: Secondary | ICD-10-CM | POA: Diagnosis present

## 2017-02-22 DIAGNOSIS — Z794 Long term (current) use of insulin: Secondary | ICD-10-CM | POA: Diagnosis not present

## 2017-02-22 DIAGNOSIS — I10 Essential (primary) hypertension: Secondary | ICD-10-CM | POA: Diagnosis present

## 2017-02-22 DIAGNOSIS — M48062 Spinal stenosis, lumbar region with neurogenic claudication: Principal | ICD-10-CM | POA: Diagnosis present

## 2017-02-22 DIAGNOSIS — M1990 Unspecified osteoarthritis, unspecified site: Secondary | ICD-10-CM | POA: Diagnosis present

## 2017-02-22 DIAGNOSIS — Z87891 Personal history of nicotine dependence: Secondary | ICD-10-CM | POA: Diagnosis not present

## 2017-02-22 DIAGNOSIS — Z79899 Other long term (current) drug therapy: Secondary | ICD-10-CM | POA: Diagnosis not present

## 2017-02-22 DIAGNOSIS — Z7982 Long term (current) use of aspirin: Secondary | ICD-10-CM | POA: Diagnosis not present

## 2017-02-22 DIAGNOSIS — E119 Type 2 diabetes mellitus without complications: Secondary | ICD-10-CM | POA: Diagnosis present

## 2017-02-22 DIAGNOSIS — Z419 Encounter for procedure for purposes other than remedying health state, unspecified: Secondary | ICD-10-CM

## 2017-02-22 DIAGNOSIS — Z973 Presence of spectacles and contact lenses: Secondary | ICD-10-CM

## 2017-02-22 DIAGNOSIS — G4733 Obstructive sleep apnea (adult) (pediatric): Secondary | ICD-10-CM | POA: Diagnosis present

## 2017-02-22 HISTORY — PX: LUMBAR LAMINECTOMY/DECOMPRESSION MICRODISCECTOMY: SHX5026

## 2017-02-22 LAB — GLUCOSE, CAPILLARY
GLUCOSE-CAPILLARY: 186 mg/dL — AB (ref 65–99)
Glucose-Capillary: 193 mg/dL — ABNORMAL HIGH (ref 65–99)
Glucose-Capillary: 151 mg/dL — ABNORMAL HIGH (ref 65–99)
Glucose-Capillary: 119 mg/dL — ABNORMAL HIGH (ref 65–99)

## 2017-02-22 LAB — TYPE AND SCREEN
ABO/RH(D): A POS
ANTIBODY SCREEN: NEGATIVE

## 2017-02-22 SURGERY — LUMBAR LAMINECTOMY/DECOMPRESSION MICRODISCECTOMY
Anesthesia: General

## 2017-02-22 MED ORDER — PHENYLEPHRINE 40 MCG/ML (10ML) SYRINGE FOR IV PUSH (FOR BLOOD PRESSURE SUPPORT)
PREFILLED_SYRINGE | INTRAVENOUS | Status: AC
  Filled 2017-02-22: qty 10

## 2017-02-22 MED ORDER — ACETAMINOPHEN 650 MG RE SUPP: 650.0000 mg | RECTAL | Status: DC | PRN

## 2017-02-22 MED ORDER — ONDANSETRON HCL 4 MG PO TABS: 4.0000 mg | ORAL_TABLET | Freq: Four times a day (QID) | ORAL | Status: DC | PRN

## 2017-02-22 MED ORDER — ONDANSETRON HCL 4 MG/2ML IJ SOLN
INTRAMUSCULAR | Status: AC
  Filled 2017-02-22: qty 2

## 2017-02-22 MED ORDER — ROCURONIUM BROMIDE 50 MG/5ML IV SOSY
PREFILLED_SYRINGE | INTRAVENOUS | Status: AC
Start: 2017-02-22 — End: 2017-02-22
  Filled 2017-02-22: qty 5

## 2017-02-22 MED ORDER — SUCCINYLCHOLINE CHLORIDE 200 MG/10ML IV SOSY
PREFILLED_SYRINGE | INTRAVENOUS | Status: DC | PRN
  Administered 2017-02-22: 140 mg via INTRAVENOUS

## 2017-02-22 MED ORDER — LIDOCAINE HCL 1 % IJ SOLN
INTRAMUSCULAR | Status: DC | PRN
  Administered 2017-02-22: 20 mL

## 2017-02-22 MED ORDER — POLYETHYLENE GLYCOL 3350 17 G PO PACK: 17.0000 g | PACK | Freq: Every day | ORAL | Status: DC | PRN

## 2017-02-22 MED ORDER — IRBESARTAN 150 MG PO TABS
300.0000 mg | ORAL_TABLET | Freq: Every day | ORAL | Status: DC
  Filled 2017-02-22: qty 2

## 2017-02-22 MED ORDER — ACETAMINOPHEN 160 MG/5ML PO SOLN: 325.0000 mg | ORAL | Status: DC | PRN

## 2017-02-22 MED ORDER — ONDANSETRON HCL 4 MG/2ML IJ SOLN
INTRAMUSCULAR | Status: DC | PRN
  Administered 2017-02-22 (×2): 4 mg via INTRAVENOUS

## 2017-02-22 MED ORDER — HYDROCODONE-ACETAMINOPHEN 5-325 MG PO TABS: 1.0000 | ORAL_TABLET | ORAL | Status: DC | PRN

## 2017-02-22 MED ORDER — ONDANSETRON HCL 4 MG/2ML IJ SOLN: 4.0000 mg | Freq: Four times a day (QID) | INTRAMUSCULAR | Status: DC | PRN

## 2017-02-22 MED ORDER — OXYCODONE-ACETAMINOPHEN 5-325 MG PO TABS: 2.0000 | ORAL_TABLET | ORAL | Status: DC | PRN

## 2017-02-22 MED ORDER — THROMBIN 5000 UNITS EX SOLR
CUTANEOUS | Status: AC
Start: 2017-02-22 — End: 2017-02-22
  Filled 2017-02-22: qty 10000

## 2017-02-22 MED ORDER — FLEET ENEMA 7-19 GM/118ML RE ENEM: 1.0000 | ENEMA | Freq: Once | RECTAL | Status: DC | PRN

## 2017-02-22 MED ORDER — ACETAMINOPHEN 325 MG PO TABS
650.0000 mg | ORAL_TABLET | ORAL | Status: DC | PRN
  Administered 2017-02-23 – 2017-02-24 (×2): 650 mg via ORAL
  Filled 2017-02-22 (×2): qty 2

## 2017-02-22 MED ORDER — ACETAMINOPHEN 325 MG PO TABS: 325.0000 mg | ORAL_TABLET | ORAL | Status: DC | PRN

## 2017-02-22 MED ORDER — BUPIVACAINE LIPOSOME 1.3 % IJ SUSP
20.0000 mL | Freq: Once | INTRAMUSCULAR | Status: DC
  Filled 2017-02-22: qty 20

## 2017-02-22 MED ORDER — LACTATED RINGERS IV SOLN
INTRAVENOUS | Status: DC
  Administered 2017-02-22 – 2017-02-23 (×2): via INTRAVENOUS

## 2017-02-22 MED ORDER — CHLORHEXIDINE GLUCONATE 4 % EX LIQD: 60.0000 mL | Freq: Once | CUTANEOUS | Status: DC

## 2017-02-22 MED ORDER — METHOCARBAMOL 500 MG PO TABS
500.0000 mg | ORAL_TABLET | Freq: Four times a day (QID) | ORAL | Status: DC | PRN
  Administered 2017-02-23 – 2017-02-24 (×2): 500 mg via ORAL
  Filled 2017-02-22 (×2): qty 1

## 2017-02-22 MED ORDER — PROPOFOL 10 MG/ML IV BOLUS
INTRAVENOUS | Status: DC | PRN
Start: 2017-02-22 — End: 2017-02-22
  Administered 2017-02-22: 200 mg via INTRAVENOUS

## 2017-02-22 MED ORDER — SUGAMMADEX SODIUM 200 MG/2ML IV SOLN
INTRAVENOUS | Status: AC
  Filled 2017-02-22: qty 2

## 2017-02-22 MED ORDER — OLMESARTAN MEDOXOMIL-HCTZ 40-25 MG PO TABS: 1.0000 | ORAL_TABLET | Freq: Every day | ORAL | Status: DC

## 2017-02-22 MED ORDER — SODIUM CHLORIDE 0.9 % IV SOLN
INTRAVENOUS | Status: DC | PRN
  Administered 2017-02-22: 500 mL

## 2017-02-22 MED ORDER — PHENYLEPHRINE 40 MCG/ML (10ML) SYRINGE FOR IV PUSH (FOR BLOOD PRESSURE SUPPORT)
PREFILLED_SYRINGE | INTRAVENOUS | Status: DC | PRN
  Administered 2017-02-22 (×4): 80 ug via INTRAVENOUS

## 2017-02-22 MED ORDER — LIDOCAINE-EPINEPHRINE 1 %-1:100000 IJ SOLN
INTRAMUSCULAR | Status: AC
  Filled 2017-02-22: qty 1

## 2017-02-22 MED ORDER — HYDROMORPHONE HCL-NACL 0.5-0.9 MG/ML-% IV SOSY: 0.5000 mg | PREFILLED_SYRINGE | INTRAVENOUS | Status: DC | PRN

## 2017-02-22 MED ORDER — MIDAZOLAM HCL 5 MG/5ML IJ SOLN
INTRAMUSCULAR | Status: DC | PRN
  Administered 2017-02-22: 2 mg via INTRAVENOUS

## 2017-02-22 MED ORDER — CEFAZOLIN SODIUM-DEXTROSE 2-4 GM/100ML-% IV SOLN
2.0000 g | INTRAVENOUS | Status: AC
  Administered 2017-02-22: 2 g via INTRAVENOUS

## 2017-02-22 MED ORDER — SUGAMMADEX SODIUM 200 MG/2ML IV SOLN
INTRAVENOUS | Status: DC | PRN
  Administered 2017-02-22: 200 mg via INTRAVENOUS

## 2017-02-22 MED ORDER — HYDROMORPHONE HCL-NACL 0.5-0.9 MG/ML-% IV SOSY: 0.2500 mg | PREFILLED_SYRINGE | INTRAVENOUS | Status: DC | PRN

## 2017-02-22 MED ORDER — LIDOCAINE 2% (20 MG/ML) 5 ML SYRINGE
INTRAMUSCULAR | Status: AC
  Filled 2017-02-22: qty 5

## 2017-02-22 MED ORDER — MIDAZOLAM HCL 2 MG/2ML IJ SOLN
INTRAMUSCULAR | Status: AC
  Filled 2017-02-22: qty 2

## 2017-02-22 MED ORDER — INSULIN ASPART 100 UNIT/ML ~~LOC~~ SOLN
0.0000 [IU] | Freq: Three times a day (TID) | SUBCUTANEOUS | Status: DC
  Administered 2017-02-23: 15:00:00 3 [IU] via SUBCUTANEOUS
  Administered 2017-02-23: 10:00:00 2 [IU] via SUBCUTANEOUS
  Administered 2017-02-23: 20:00:00 5 [IU] via SUBCUTANEOUS
  Administered 2017-02-24: 2 [IU] via SUBCUTANEOUS

## 2017-02-22 MED ORDER — CEFAZOLIN SODIUM-DEXTROSE 1-4 GM/50ML-% IV SOLN
1.0000 g | Freq: Three times a day (TID) | INTRAVENOUS | Status: AC
  Administered 2017-02-22 – 2017-02-23 (×3): 1 g via INTRAVENOUS
  Filled 2017-02-22 (×3): qty 50

## 2017-02-22 MED ORDER — METHOCARBAMOL 1000 MG/10ML IJ SOLN
500.0000 mg | Freq: Four times a day (QID) | INTRAVENOUS | Status: DC | PRN
  Filled 2017-02-22: qty 5

## 2017-02-22 MED ORDER — PHENOL 1.4 % MT LIQD
1.0000 | OROMUCOSAL | Status: DC | PRN
  Filled 2017-02-22: qty 177

## 2017-02-22 MED ORDER — BACITRACIN-NEOMYCIN-POLYMYXIN 400-5-5000 EX OINT
TOPICAL_OINTMENT | CUTANEOUS | Status: AC
  Filled 2017-02-22: qty 1

## 2017-02-22 MED ORDER — ROCURONIUM BROMIDE 50 MG/5ML IV SOSY
PREFILLED_SYRINGE | INTRAVENOUS | Status: AC
  Filled 2017-02-22: qty 5

## 2017-02-22 MED ORDER — CEFAZOLIN SODIUM-DEXTROSE 2-4 GM/100ML-% IV SOLN
INTRAVENOUS | Status: AC
  Filled 2017-02-22: qty 100

## 2017-02-22 MED ORDER — PROPOFOL 10 MG/ML IV BOLUS
INTRAVENOUS | Status: AC
  Filled 2017-02-22: qty 20

## 2017-02-22 MED ORDER — FENTANYL CITRATE (PF) 100 MCG/2ML IJ SOLN
INTRAMUSCULAR | Status: AC
  Filled 2017-02-22: qty 2

## 2017-02-22 MED ORDER — BUPIVACAINE-EPINEPHRINE (PF) 0.5% -1:200000 IJ SOLN
INTRAMUSCULAR | Status: AC
  Filled 2017-02-22: qty 30

## 2017-02-22 MED ORDER — SODIUM CHLORIDE 0.9 % IV SOLN
INTRAVENOUS | Status: AC
  Filled 2017-02-22: qty 500000

## 2017-02-22 MED ORDER — MENTHOL 3 MG MT LOZG: 1.0000 | LOZENGE | OROMUCOSAL | Status: DC | PRN

## 2017-02-22 MED ORDER — OXYCODONE HCL 5 MG PO TABS
5.0000 mg | ORAL_TABLET | Freq: Once | ORAL | Status: DC | PRN
Start: 2017-02-22 — End: 2017-02-22

## 2017-02-22 MED ORDER — LACTATED RINGERS IV SOLN
INTRAVENOUS | Status: DC
  Administered 2017-02-22 (×3): via INTRAVENOUS

## 2017-02-22 MED ORDER — HEMOSTATIC AGENTS (NO CHARGE) OPTIME
TOPICAL | Status: DC | PRN
  Administered 2017-02-22: 1 via TOPICAL

## 2017-02-22 MED ORDER — BACITRACIN-NEOMYCIN-POLYMYXIN 400-5-5000 EX OINT
TOPICAL_OINTMENT | CUTANEOUS | Status: DC | PRN
Start: 2017-02-22 — End: 2017-02-22
  Administered 2017-02-22: 1 via TOPICAL

## 2017-02-22 MED ORDER — BISACODYL 5 MG PO TBEC: 5.0000 mg | DELAYED_RELEASE_TABLET | Freq: Every day | ORAL | Status: DC | PRN

## 2017-02-22 MED ORDER — LIDOCAINE 2% (20 MG/ML) 5 ML SYRINGE
INTRAMUSCULAR | Status: DC | PRN
  Administered 2017-02-22 (×2): 100 mg via INTRAVENOUS

## 2017-02-22 MED ORDER — OXYCODONE HCL 5 MG/5ML PO SOLN: 5.0000 mg | Freq: Once | ORAL | Status: DC | PRN

## 2017-02-22 MED ORDER — ROCURONIUM BROMIDE 10 MG/ML (PF) SYRINGE
PREFILLED_SYRINGE | INTRAVENOUS | Status: DC | PRN
  Administered 2017-02-22: 10 mg via INTRAVENOUS
  Administered 2017-02-22: 50 mg via INTRAVENOUS

## 2017-02-22 MED ORDER — HYDROCHLOROTHIAZIDE 25 MG PO TABS
25.0000 mg | ORAL_TABLET | Freq: Every day | ORAL | Status: DC
  Filled 2017-02-22: qty 1

## 2017-02-22 MED ORDER — SUCCINYLCHOLINE CHLORIDE 200 MG/10ML IV SOSY
PREFILLED_SYRINGE | INTRAVENOUS | Status: AC
  Filled 2017-02-22: qty 10

## 2017-02-22 MED ORDER — FENTANYL CITRATE (PF) 100 MCG/2ML IJ SOLN
INTRAMUSCULAR | Status: DC | PRN
  Administered 2017-02-22 (×2): 50 ug via INTRAVENOUS

## 2017-02-22 SURGICAL SUPPLY — 54 items
BAG ZIPLOCK 12X15 (MISCELLANEOUS) IMPLANT
BENZOIN TINCTURE PRP APPL 2/3 (GAUZE/BANDAGES/DRESSINGS) ×3 IMPLANT
CLEANER TIP ELECTROSURG 2X2 (MISCELLANEOUS) ×3 IMPLANT
CLOSURE WOUND 1/2 X4 (GAUZE/BANDAGES/DRESSINGS) ×1
COVER SURGICAL LIGHT HANDLE (MISCELLANEOUS) ×3 IMPLANT
DRAIN PENROSE 18X1/4 LTX STRL (WOUND CARE) IMPLANT
DRAPE MICROSCOPE LEICA (MISCELLANEOUS) ×3 IMPLANT
DRAPE POUCH INSTRU U-SHP 10X18 (DRAPES) ×3 IMPLANT
DRAPE SHEET LG 3/4 BI-LAMINATE (DRAPES) ×3 IMPLANT
DRAPE SURG 17X11 SM STRL (DRAPES) ×3 IMPLANT
DRSG ADAPTIC 3X8 NADH LF (GAUZE/BANDAGES/DRESSINGS) ×3 IMPLANT
DRSG PAD ABDOMINAL 8X10 ST (GAUZE/BANDAGES/DRESSINGS) ×12 IMPLANT
DURAFORM SPONGE 2X2 SINGLE (Neuro Prosthesis/Implant) ×3 IMPLANT
DURAPREP 26ML APPLICATOR (WOUND CARE) ×3 IMPLANT
ELECT BLADE TIP CTD 4 INCH (ELECTRODE) ×3 IMPLANT
ELECT REM PT RETURN 15FT ADLT (MISCELLANEOUS) ×3 IMPLANT
GAUZE SPONGE 4X4 12PLY STRL (GAUZE/BANDAGES/DRESSINGS) ×3 IMPLANT
GLOVE BIOGEL PI IND STRL 6.5 (GLOVE) ×3 IMPLANT
GLOVE BIOGEL PI IND STRL 7.5 (GLOVE) ×2 IMPLANT
GLOVE BIOGEL PI IND STRL 8.5 (GLOVE) ×1 IMPLANT
GLOVE BIOGEL PI INDICATOR 6.5 (GLOVE) ×6
GLOVE BIOGEL PI INDICATOR 7.5 (GLOVE) ×4
GLOVE BIOGEL PI INDICATOR 8.5 (GLOVE) ×2
GLOVE ECLIPSE 8.0 STRL XLNG CF (GLOVE) ×6 IMPLANT
GLOVE SURG SS PI 6.5 STRL IVOR (GLOVE) ×3 IMPLANT
GOWN STRL REUS W/ TWL LRG LVL3 (GOWN DISPOSABLE) ×1 IMPLANT
GOWN STRL REUS W/TWL LRG LVL3 (GOWN DISPOSABLE) ×2
GOWN STRL REUS W/TWL XL LVL3 (GOWN DISPOSABLE) ×6 IMPLANT
HEMOSTAT SPONGE AVITENE ULTRA (HEMOSTASIS) ×3 IMPLANT
KIT BASIN OR (CUSTOM PROCEDURE TRAY) ×3 IMPLANT
KIT POSITIONING SURG ANDREWS (MISCELLANEOUS) ×3 IMPLANT
MANIFOLD NEPTUNE II (INSTRUMENTS) ×3 IMPLANT
MARKER SKIN DUAL TIP RULER LAB (MISCELLANEOUS) ×3 IMPLANT
NDL SAFETY ECLIPSE 18X1.5 (NEEDLE) ×1 IMPLANT
NEEDLE HYPO 18GX1.5 SHARP (NEEDLE) ×2
NEEDLE HYPO 22GX1.5 SAFETY (NEEDLE) ×6 IMPLANT
NEEDLE SPNL 18GX3.5 QUINCKE PK (NEEDLE) ×6 IMPLANT
PACK LAMINECTOMY ORTHO (CUSTOM PROCEDURE TRAY) ×3 IMPLANT
PATTIES SURGICAL .5 X.5 (GAUZE/BANDAGES/DRESSINGS) IMPLANT
PATTIES SURGICAL .75X.75 (GAUZE/BANDAGES/DRESSINGS) ×3 IMPLANT
PATTIES SURGICAL 1X1 (DISPOSABLE) IMPLANT
PIN SAFETY NICK PLATE  2 MED (MISCELLANEOUS)
PIN SAFETY NICK PLATE 2 MED (MISCELLANEOUS) IMPLANT
SPONGE LAP 4X18 X RAY DECT (DISPOSABLE) ×15 IMPLANT
SPONGE SURGIFOAM ABS GEL 100 (HEMOSTASIS) ×3 IMPLANT
STAPLER VISISTAT 35W (STAPLE) ×3 IMPLANT
STRIP CLOSURE SKIN 1/2X4 (GAUZE/BANDAGES/DRESSINGS) ×2 IMPLANT
SUT VIC AB 1 CT1 27 (SUTURE) ×4
SUT VIC AB 1 CT1 27XBRD ANTBC (SUTURE) ×2 IMPLANT
SUT VIC AB 2-0 CT1 27 (SUTURE) ×4
SUT VIC AB 2-0 CT1 TAPERPNT 27 (SUTURE) ×2 IMPLANT
SYR 20CC LL (SYRINGE) ×6 IMPLANT
SYRINGE 20CC LL (MISCELLANEOUS) ×3 IMPLANT
TOWEL OR 17X26 10 PK STRL BLUE (TOWEL DISPOSABLE) ×3 IMPLANT

## 2017-02-22 NOTE — Anesthesia Postprocedure Evaluation (Signed)
Anesthesia Post Note  Patient: Phillip Holloway  Procedure(s) Performed: Decompression L3-L4, L-4-L5 (N/A )     Patient location during evaluation: PACU Anesthesia Type: General Level of consciousness: awake and alert Pain management: pain level controlled Vital Signs Assessment: post-procedure vital signs reviewed and stable Respiratory status: spontaneous breathing, nonlabored ventilation, respiratory function stable and patient connected to nasal cannula oxygen Cardiovascular status: blood pressure returned to baseline and stable Postop Assessment: no apparent nausea or vomiting Anesthetic complications: no    Last Vitals:  Vitals:   02/22/17 1145 02/22/17 1210  BP: (!) 141/86 123/65  Pulse: 68 68  Resp: 12 14  Temp: 36.5 C 36.6 C  SpO2: 100% 100%    Last Pain:  Vitals:   02/22/17 1107  TempSrc:   PainSc: 0-No pain                 Silver Parkey

## 2017-02-22 NOTE — Transfer of Care (Signed)
Immediate Anesthesia Transfer of Care Note  Patient: MORTON SIMSON  Procedure(s) Performed: Decompression L3-L4, L-4-L5 (N/A )  Patient Location: PACU  Anesthesia Type:General  Level of Consciousness: sedated  Airway & Oxygen Therapy: Patient Spontanous Breathing and Patient connected to face mask oxygen  Post-op Assessment: Report given to RN and Post -op Vital signs reviewed and stable  Post vital signs: Reviewed and stable  Last Vitals:  Vitals:   02/22/17 0635  BP: 114/75  Pulse: 72  Resp: 18  Temp: 36.6 C  SpO2: 98%    Last Pain:  Vitals:   02/22/17 0648  TempSrc:   PainSc: 5       Patients Stated Pain Goal: 4 (58/25/18 9842)  Complications: No apparent anesthesia complications

## 2017-02-22 NOTE — Brief Op Note (Signed)
02/22/2017  10:52 AM  PATIENT:  Phillip Holloway  66 y.o. 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  POST-OPERATIVE DIAGNOSIS:  Same as Pre-Op  PROCEDURE:  Procedure(s): Decompression L3-L4, L-4-L5 (N/A) and Foraminotomies of L-4 and L-5 nerve roots,Bilaterally for Spinal Stenosis  SURGEON:  Surgeon(s) and Role:    Latanya Maudlin, MD - Primary  PHYSICIAN ASSISTANT:Amber Preston PA   ASSISTANTS: Ardeen Jourdain PA   ANESTHESIA:   general  EBL:  Total I/O In: 2000 [I.V.:2000] Out: 300 [Urine:150; Blood:150]  BLOOD ADMINISTERED:none  DRAINS: none  LOCAL MEDICATIONS USED:  MARCAINE 20cc of 0.50% with Epinephrine at start of the case and 20cc of Exparel at the end of the case.    SPECIMEN:  No Specimen  DISPOSITION OF SPECIMEN:  N/A  COUNTS:  YES  TOURNIQUET:  * No tourniquets in log *  DICTATION: .Other Dictation: Dictation Number [id number]  PLAN OF CARE: Admit to inpatient   PATIENT DISPOSITION:  PACU - hemodynamically stable.   Delay start of Pharmacological VTE agent (>24hrs) due to surgical blood loss or risk of bleeding: yes

## 2017-02-22 NOTE — Progress Notes (Signed)
Inpatient Diabetes Program Recommendations  AACE/ADA: New Consensus Statement on Inpatient Glycemic Control (2015)  Target Ranges:  Prepandial:   less than 140 mg/dL      Peak postprandial:   less than 180 mg/dL (1-2 hours)      Critically ill patients:  140 - 180 mg/dL   Lab Results  Component Value Date   GLUCAP 151 (H) 02/22/2017   HGBA1C 9.5 (H) 02/17/2017    Review of Glycemic Control  Diabetes history: DM2 Outpatient Diabetes medications: Tresiba 28 units QAM, Synjardy 09/998 mg bid, Januvia 100 mg QHS Current orders for Inpatient glycemic control: Novolog 0-15 units tidwc  Inpatient Diabetes Program Recommendations:    Add HS correction Lantus 28 units QAM  Will speak with pt on 10/11 regarding HgbA1C of 9.5%.   Will follow.  Thank you. Lorenda Peck, RD, LDN, CDE Inpatient Diabetes Coordinator [phone]

## 2017-02-22 NOTE — Anesthesia Procedure Notes (Signed)
Procedure Name: Intubation Date/Time: 02/22/2017 8:28 AM Performed by: Lind Covert Pre-anesthesia Checklist: Patient identified, Emergency Drugs available, Suction available, Patient being monitored and Timeout performed Patient Re-evaluated:Patient Re-evaluated prior to induction Oxygen Delivery Method: Circle system utilized Preoxygenation: Pre-oxygenation with 100% oxygen Induction Type: IV induction Laryngoscope Size: Mac and 4 Grade View: Grade I Tube type: Oral Tube size: 7.5 mm Number of attempts: 1 Airway Equipment and Method: Stylet Placement Confirmation: ETT inserted through vocal cords under direct vision,  positive ETCO2 and breath sounds checked- equal and bilateral Secured at: 22 cm Tube secured with: Tape Dental Injury: Teeth and Oropharynx as per pre-operative assessment

## 2017-02-22 NOTE — Interval H&P Note (Signed)
History and Physical Interval Note:  02/22/2017 8:25 AM  Phillip Holloway  has presented today for surgery, with the diagnosis of Spinal Stenosis L4-L5, L3-L4  The various methods of treatment have been discussed with the patient and family. After consideration of risks, benefits and other options for treatment, the patient has consented to  Procedure(s): Decompression L3-L4, L-4-L5 (N/A) as a surgical intervention .  The patient's history has been reviewed, patient examined, no change in status, stable for surgery.  I have reviewed the patient's chart and labs.  Questions were answered to the patient's satisfaction.     Carmelle Bamberg A

## 2017-02-22 NOTE — H&P (Signed)
Phillip Holloway is an 66 y.o. male.   Chief Complaint: Pain in Right Leg and weakness. HPI: He has developed progressive weakness in his Right leg.  Past Medical History:  Diagnosis Date  . Arthritis   . Diabetes mellitus without complication ([place])    type 2  . Elevated PSA   . Hypertension   . Phimosis   . Sleep apnea 2004   pt states he has been dx'd w osa, but didn't follow- up  no mask pt. lost weight  . Wears glasses     Past Surgical History:  Procedure Laterality Date  . CIRCUMCISION N/A 08/18/2015   Procedure: CIRCUMCISION WITH BIOPSY OF PENILE NEVUS ;  Surgeon: Irine Seal, MD;  Location: [facility];  Service: Urology;  Laterality: N/A;  . CYST EXCISION Right    wrist  . PROSTATE BIOPSY N/A 08/18/2015   Procedure: PROSTATE ULTRASOUND AND BIOPSY;  Surgeon: Irine Seal, MD;  Location: [facility];  Service: Urology;  Laterality: N/A;    No family history on file. Social History:  reports that he quit smoking about 2 years ago. His smoking use included Cigarettes. He has never used smokeless tobacco. He reports that he drinks alcohol. He reports that he does not use drugs.  Allergies: No Known Allergies  Medications Prior to Admission  Medication Sig Dispense Refill  . aspirin EC 81 MG tablet Take 81 mg by mouth daily.    . Aspirin-Salicylamide-Caffeine (BC HEADACHE POWDER PO) Take 1 packet by mouth daily as needed (pain).    Marland Kitchen atorvastatin (LIPITOR) 40 MG tablet Take 40 mg by mouth at bedtime.    . Cholecalciferol (D 5000) 5000 units capsule Take 5,000 Units by mouth daily.    . Empagliflozin-Metformin HCl (SYNJARDY) 09-998 MG TABS Take 2 tablets by mouth daily.    . Insulin Degludec (TRESIBA FLEXTOUCH [place]) Inject 28 Units into the skin every morning.     . olmesartan-hydrochlorothiazide (BENICAR HCT) 40-25 MG per tablet Take 1 tablet by mouth daily.    . sitaGLIPtin (JANUVIA) 100 MG tablet Take 100 mg by mouth every evening.      Results  for orders placed or performed during the hospital encounter of 02/22/17 (from the past 48 hour(s))  Glucose, capillary     Status: Abnormal   Collection Time: 02/22/17  6:52 AM  Result Value Ref Range   Glucose-Capillary 193 (H) 65 - 99 mg/dL   Comment 1 Notify RN    Comment 2 Document in Chart   Type and screen [place]     Status: None (Preliminary result)   Collection Time: 02/22/17  7:55 AM  Result Value Ref Range   ABO/RH(D) A POS    Antibody Screen PENDING    Sample Expiration 02/25/2017    No results found.  Review of Systems  Constitutional: Negative.   HENT: Negative.   Eyes: Negative.   Respiratory: Negative.   Cardiovascular: Negative.   Gastrointestinal: Negative.   Genitourinary: Negative.   Musculoskeletal: Positive for back pain.  Skin: Negative.   Neurological: Positive for focal weakness.  Endo/Heme/Allergies: Negative.     Blood pressure 114/75, pulse 72, temperature 97.8 F (36.6 C), temperature source Oral, resp. rate 18, SpO2 98 %. Physical Exam  Constitutional: He appears well-developed.  HENT:  Head: Normocephalic.  Eyes: Pupils are equal, round, and reactive to light.  Neck: Normal range of motion.  Cardiovascular: Normal rate.   Respiratory: Effort normal.  GI: Soft.  Musculoskeletal: He exhibits tenderness.  Neurological:  Weakness of Right Foot Dorsiflexiors  Skin: Skin is warm.  Psychiatric: He has a normal mood and affect.     Assessment/Plan Decompressive  lumbar Laminectomies of L-3-L-4 and L-4-L-5 for Spinal Stenosis.  Tobi Bastos, MD 02/22/2017, 8:19 AM

## 2017-02-22 NOTE — Anesthesia Preprocedure Evaluation (Signed)
Anesthesia Evaluation  Patient identified by MRN, date of birth, ID band Patient awake    Reviewed: Allergy & Precautions, NPO status , Patient's Chart, lab work & pertinent test results  History of Anesthesia Complications Negative for: history of anesthetic complications  Airway Mallampati: III  TM Distance: <3 FB Neck ROM: Full    Dental  (+) Teeth Intact,    Pulmonary sleep apnea , former smoker,    breath sounds clear to auscultation       Cardiovascular hypertension, Pt. on medications (-) angina(-) Past MI and (-) CHF  Rhythm:Regular     Neuro/Psych negative neurological ROS  negative psych ROS   GI/Hepatic negative GI ROS, Neg liver ROS,   Endo/Other  diabetes, Type 2, Insulin Dependent  Renal/GU negative Renal ROS     Musculoskeletal  (+) Arthritis ,   Abdominal   Peds  Hematology negative hematology ROS (+)   Anesthesia Other Findings   Reproductive/Obstetrics                             Anesthesia Physical Anesthesia Plan  ASA: II  Anesthesia Plan: General   Post-op Pain Management:    Induction: Intravenous  PONV Risk Score and Plan: 2 and Ondansetron and Dexamethasone  Airway Management Planned: Oral ETT  Additional Equipment: None  Intra-op Plan:   Post-operative Plan: Extubation in OR  Informed Consent: I have reviewed the patients History and Physical, chart, labs and discussed the procedure including the risks, benefits and alternatives for the proposed anesthesia with the patient or authorized representative who has indicated his/her understanding and acceptance.   Dental advisory given  Plan Discussed with: CRNA and Surgeon  Anesthesia Plan Comments:         Anesthesia Quick Evaluation

## 2017-02-23 LAB — GLUCOSE, CAPILLARY
GLUCOSE-CAPILLARY: 162 mg/dL — AB (ref 65–99)
GLUCOSE-CAPILLARY: 141 mg/dL — AB (ref 65–99)
GLUCOSE-CAPILLARY: 238 mg/dL — AB (ref 65–99)
GLUCOSE-CAPILLARY: 198 mg/dL — AB (ref 65–99)
Glucose-Capillary: 166 mg/dL — ABNORMAL HIGH (ref 65–99)

## 2017-02-23 NOTE — Op Note (Signed)
NAME:  Phillip Holloway, Phillip Holloway                ACCOUNT NO.:  MEDICAL RECORD NO.:  [id number]  LOCATION:                                 FACILITY:  PHYSICIAN:  Kristalyn Bergstresser A. Sasuke Yaffe, M.D.DATE OF BIRTH:  [birth year]  DATE OF PROCEDURE: DATE OF DISCHARGE:                              OPERATIVE REPORT   SURGEON:  Tylek Boney A. Gladstone Lighter, M.D.  ASSISTANT:  Ardeen Jourdain, [place]  PREOPERATIVE DIAGNOSES: 1. Severe spinal stenosis with a complete block at L4-5. 2. Partial block at L3-4 causing spinal stenosis. 3. Foraminal stenosis involving the L4 and the L5 roots bilaterally. 4. Partial footdrop on the right.  POSTOPERATIVE DIAGNOSES: 1. Severe spinal stenosis with a complete block at L4-5. 2. Partial block at L3-4 causing spinal stenosis. 3. Foraminal stenosis involving the L4 and the L5 roots bilaterally. 4. Partial footdrop on the right secondary to the complete block.  OPERATIONS: 1. Central decompressive lumbar laminectomy for severe spinal stenosis     with a complete block at L4-5. 2. Decompressive lumbar laminectomy at L3-4 for partial block at L3-4. 3. Foraminotomy for the L4 root on the right. 4. Foraminotomy for the L4 root on the left. 5. Foraminotomy for the L5 root on the left. 6. Foraminotomy for the L5 root on the right.  DESCRIPTION OF PROCEDURE:  Under general anesthesia, routine orthopedic prep and draping of the back was carried out with the patient on a spinal frame.  Appropriate time-out was carried out.  I also marked the right side of his back in the holding area because most of his pain was in the right leg.  He also had weakness of the dorsiflexors of the right foot.  Two needles were placed in the back for localization purposes.  X- ray was taken to verify the position.  The patient had 2 g of IV Ancef as well.  At this time, an incision was made over the posterior aspect of the lumbar spine in usual fashion.  The incision was extended proximal and distal to the L4-5  region.  Note, he did have a transitional vertebrae, so all the procedures were done on the basis of the numbered myelogram.  At this time, the muscle was separated from the lamina and spinous processes bilaterally.  Bleeders were identified and coagulated.  The another x-ray was taken with Kocher clamps in place. Following this, we then inserted our [place] retractors.  We then separated the muscle further from the lamina until we had complete visualization.  I then removed the spinous process of L3, portion of that.  I then removed the entire spinous process of L4 and went down, did a complete decompressive lumbar laminectomy at L3 and L4.  Note L4- 5, he had a severe constriction.  This is where he had a complete block. The bone was extremely hard.  Once we managed to get down to the thinness layer of the bone, we were able then to bring the microscope in and another x-ray was taken.  I then began my laminectomy distally and proximally in the usual fashion.  The ligamentum flavum was extremely thick and adhered to the dura.  We had a very small tiny pinhole  leak during the procedure, which was easily taken care of.  There were no complete tears of the dura, no nerve root exposed.  At this particular time, we then protected the dura with a cottonoid and went up proximally until we had complete decompression.  We went distally until we had complete decompression.  I then went out and did foraminotomies so that the foramina now were opened and nerve roots were completely decompressed.  We went out into the lateral recesses as well.  Another x- ray was taken to verify our position.  We thoroughly irrigated out the area.  I loosely applied a Duracell patch over that small pinhole area in the dura and I then applied some thrombin-soaked Gelfoam.  At the end of the procedure, we did a Valsalva maneuver to make sure we had control of the leak, which we did.  The wound then was closed in  layers in usual fashion.  We left a small distal deep and proximal deep part of the wound open for drainage purposes.  We injected, at the beginning of the case, 20 mL of 0.5% Marcaine with epinephrine at the end of the case, I injected 20 mL of Exparel into the soft tissue.  He did have a Foley catheter in place as well.  The wound then was closed as I mentioned was final skin closure was closed with staples.  Sterile dressings were applied.  We did a 2-level decompression on him.          ______________________________ Kipp Brood. Gladstone Lighter, M.D.     RAG/MEDQ  D:  02/22/2017  T:  02/23/2017  Job:  [id number]

## 2017-02-23 NOTE — Care Management Note (Signed)
Case Management Note  Patient Details  Name: Phillip Holloway MRN: [id number] Date of Birth: 1950-08-25  Subjective/Objective: 66 y/o m admitted w/Spinal stenosis. From home w/spouse. On Bedrest.PT cons-await recc.                   Action/Plan:d/c plan home.   Expected Discharge Date:                  Expected Discharge Plan:  Home/Self Care  In-House Referral:     Discharge planning Services  CM Consult  Post Acute Care Choice:    Choice offered to:     DME Arranged:    DME Agency:     HH Arranged:    HH Agency:     Status of Service:  In process, will continue to follow  If discussed at Long Length of Stay Meetings, dates discussed:    Additional Comments:  Dessa Phi, RN 02/23/2017, 1:10 PM

## 2017-02-23 NOTE — Progress Notes (Addendum)
   Subjective: 1 Day Post-Op Procedure(s) (LRB): Decompression L3-L4, L-4-L5 (N/A) Patient reports pain as mild.   Patient seen in rounds for Dr. Gladstone Lighter. Patient is well, and has had no acute complaints or problems other than frustration about being on bed rest. Denies headache and dizziness. Reports foley was just removed and no voiding on his own just yet. Positive flatus. No BM.  Plan is to go Home after hospital stay.  Objective: Vital signs in last 24 hours: Temp:  [97.7 F (36.5 C)-99.8 F (37.7 C)] 99.3 F (37.4 C) (10/11 0553) Pulse Rate:  [61-96] 90 (10/11 0553) Resp:  [12-20] 16 (10/11 0553) BP: (94-152)/(52-95) 109/56 (10/11 0553) SpO2:  [98 %-100 %] 99 % (10/11 0553) Weight:  [93.9 kg (207 lb)] 93.9 kg (207 lb) (10/10 1719)  Intake/Output from previous day:  Intake/Output Summary (Last 24 hours) at 02/23/17 0625 Last data filed at 02/23/17 0554  Gross per 24 hour  Intake          3791.67 ml  Output             2400 ml  Net          1391.67 ml    Intake/Output this shift: Total I/O In: 240 [P.O.:240] Out: 1350 [Urine:1350]   EXAM General - Patient is Alert and Oriented Extremity - Neurologically intact Dorsiflexion/Plantar flexion intact Incision: no drainage Dressing/Incision - clean, dry, no drainage Motor Function - intact, moving foot and toes well on exam.   Past Medical History:  Diagnosis Date  . Arthritis   . Diabetes mellitus without complication ([place])    type 2  . Elevated PSA   . Hypertension   . Phimosis   . Sleep apnea 2004   pt states he has been dx'd w osa, but didn't follow- up  no mask pt. lost weight  . Wears glasses     Assessment/Plan: 1 Day Post-Op Procedure(s) (LRB): Decompression L3-L4, L-4-L5 (N/A) Active Problems:   Spinal stenosis, lumbar region with neurogenic claudication  Estimated body mass index is 27.31 kg/m as calculated from the following:   Height as of this encounter: 6\' 1"  (1.854 m).   Weight as of this  encounter: 93.9 kg (207 lb). Advance diet D/C IV fluids when tolerating POs well  DVT Prophylaxis - SCDs   Patient is currently on bed rest. Will remain on bed rest until this afternoon due to dural leak. Will discuss with Dr. Gladstone Lighter about possibly letting him get up and ambulate later this evening. Plan for DC home tomorrow pending progress. Will monitor labs, voiding, BM.  Ardeen Jourdain, PA-C Orthopaedic Surgery 02/23/2017, 6:25 AM

## 2017-02-23 NOTE — Progress Notes (Signed)
OT Cancellation Note  Patient Details Name: Phillip Holloway MRN: [id number] DOB: 1950-08-05   Cancelled Treatment:    Reason Eval/Treat Not Completed: Medical issues which prohibited therapy.  Noted pt is on bedrest until 1600. Will see pt tomorrow.  [name] 02/23/2017, 7:07 AM  Lesle Chris, OTR/L [phone] 02/23/2017

## 2017-02-23 NOTE — Evaluation (Signed)
Physical Therapy Evaluation Patient Details Name: Phillip Holloway MRN: [id number] DOB: 1950-06-27 Today's Date: 02/23/2017   History of Present Illness  Pt s/p L3-4, L4-5 decompressive laminectomy with dural leak and with hx of DM  Clinical Impression  Pt s/p decompressive laminectomy and presents with functional mobility limitations 2* post op pain and back precautions. Pt should progress to dc home with family assist.    Follow Up Recommendations No PT follow up    Equipment Recommendations  Other (comment) (TBD)    Recommendations for Other Services OT consult     Precautions / Restrictions Precautions Precautions: Back Precaution Booklet Issued: Yes (comment) Restrictions Weight Bearing Restrictions: No      Mobility  Bed Mobility Overal bed mobility: Needs Assistance Bed Mobility: Supine to Sit     Supine to sit: Min assist;Min guard     General bed mobility comments: cues for correct log roll technique and adherence to back precautions  Transfers Overall transfer level: Needs assistance Equipment used: Rolling walker (2 wheeled) Transfers: Sit to/from Stand Sit to Stand: Min assist;Min guard         General transfer comment: cues for transition position, adherence to back precautions and use of UEs to self assist  Ambulation/Gait Ambulation/Gait assistance: Min assist;Min guard Ambulation Distance (Feet): 300 Feet Assistive device: Rolling walker (2 wheeled) Gait Pattern/deviations: Step-through pattern;Decreased step length - right;Decreased step length - left;Shuffle Gait velocity: decr Gait velocity interpretation: Below normal speed for age/gender General Gait Details: cues for posture and position from RW  Stairs            Wheelchair Mobility    Modified Rankin (Stroke Patients Only)       Balance                                             Pertinent Vitals/Pain Pain Assessment: 0-10 Pain Score: 4  Pain  Location: back Pain Descriptors / Indicators: Aching;Sore Pain Intervention(s): Limited activity within patient's tolerance;Monitored during session;Premedicated before session    [place] expects to be discharged to:: Private residence Living Arrangements: Spouse/significant other Available Help at Discharge: Family Type of Home: House Home Access: Stairs to enter Entrance Stairs-Rails: Left Entrance Stairs-Number of Steps: 4 Home Layout: Two level Home Equipment: None      Prior Function Level of Independence: Independent               Hand Dominance        Extremity/Trunk Assessment   Upper Extremity Assessment Upper Extremity Assessment: Overall WFL for tasks assessed    Lower Extremity Assessment Lower Extremity Assessment: Overall WFL for tasks assessed       Communication   Communication: No difficulties  Cognition Arousal/Alertness: Awake/alert Behavior During Therapy: WFL for tasks assessed/performed Overall Cognitive Status: Within Functional Limits for tasks assessed                                        General Comments      Exercises     Assessment/Plan    PT Assessment Patient needs continued PT services  PT Problem List Decreased activity tolerance;Decreased mobility;Decreased knowledge of use of DME;Pain;Decreased knowledge of precautions       PT Treatment Interventions DME instruction;Gait training;Stair training;Functional mobility training;Therapeutic  activities;Therapeutic exercise;Patient/family education    PT Goals (Current goals can be found in the Care Plan section)  Acute Rehab PT Goals Patient Stated Goal: Regain IND  PT Goal Formulation: With patient Time For Goal Achievement: 02/25/17 Potential to Achieve Goals: Good    Frequency 7X/week   Barriers to discharge        Co-evaluation               AM-PAC PT "6 Clicks" Daily Activity  Outcome Measure Difficulty turning  over in bed (including adjusting bedclothes, sheets and blankets)?: A Lot Difficulty moving from lying on back to sitting on the side of the bed? : A Lot Difficulty sitting down on and standing up from a chair with arms (e.g., wheelchair, bedside commode, etc,.)?: A Lot Help needed moving to and from a bed to chair (including a wheelchair)?: A Little Help needed walking in hospital room?: A Little Help needed climbing 3-5 steps with a railing? : A Little 6 Click Score: 15    End of Session   Activity Tolerance: Patient tolerated treatment well Patient left: in chair;with call bell/phone within reach Nurse Communication: Mobility status PT Visit Diagnosis: Difficulty in walking, not elsewhere classified (R26.2)    Time: 7829-5621 PT Time Calculation (min) (ACUTE ONLY): 29 min   Charges:   PT Evaluation $PT Eval Low Complexity: 1 Low PT Treatments $Gait Training: 8-22 mins   PT G Codes:        Pg [phone]   Kerri Asche 02/23/2017, 6:20 PM

## 2017-02-24 LAB — GLUCOSE, CAPILLARY
Glucose-Capillary: 140 mg/dL — ABNORMAL HIGH (ref 65–99)
Glucose-Capillary: 141 mg/dL — ABNORMAL HIGH (ref 65–99)

## 2017-02-24 MED ORDER — HYDROCODONE-ACETAMINOPHEN 5-325 MG PO TABS: 1.0000 | ORAL_TABLET | ORAL | 0 refills | Status: DC | PRN

## 2017-02-24 MED ORDER — METHOCARBAMOL 500 MG PO TABS: 500.0000 mg | ORAL_TABLET | Freq: Four times a day (QID) | ORAL | 0 refills | Status: DC | PRN

## 2017-02-24 NOTE — Discharge Instructions (Signed)
For the first three days, remove your dressing, and tape a piece of saran wrap over your incision. Take your shower, then remove the saran wrap and put a clean dressing on. After three days you can shower without the saran wrap.  No lifting or bending. No driving while taking pain medications.  Do not take blood pressure medication unless BP is 130/90. Check blood pressure 2x a day. Call Dr. Gladstone Lighter if any wound complications or temperature of 101 degrees F or over.  Call the office for an appointment to see Dr. Gladstone Lighter in two weeks: [phone] and ask for Dr. Charlestine Night nurse, Brunilda Payor.

## 2017-02-24 NOTE — Evaluation (Signed)
Occupational Therapy Evaluation Patient Details Name: Phillip Holloway MRN: [id number] DOB: [birth year] Today's Date: 02/24/2017    History of Present Illness Pt s/p L3-4, L4-5 decompressive laminectomy with dural leak and with hx of DM   Clinical Impression   This 66 year old man was admitted for the above.  All education was completed and pt verbalizes understanding.  No further OT is needed at this time    Follow Up Recommendations  Supervision/Assistance - 24 hour    Equipment Recommendations  None recommended by OT    Recommendations for Other Services       Precautions / Restrictions Precautions Precautions: Back Precaution Booklet Issued: Yes (comment) Restrictions Weight Bearing Restrictions: No      Mobility Bed Mobility               General bed mobility comments: oob  Transfers   Equipment used: Rolling walker (2 wheeled)   Sit to Stand: Min guard              Balance                                           ADL either performed or assessed with clinical judgement   ADL Overall ADL's : Needs assistance/impaired Eating/Feeding: Independent   Grooming: Oral care;Supervision/safety;Standing   Upper Body Bathing: Set up;Sitting   Lower Body Bathing: Moderate assistance;Sit to/from stand   Upper Body Dressing : Set up;Sitting   Lower Body Dressing: Maximal assistance;Sit to/from stand   Toilet Transfer: Min guard;Ambulation;Comfort height toilet;Grab bars   Toileting- Clothing Manipulation and Hygiene: Minimal assistance;Sit to/from stand   Tub/ Shower Transfer: Min guard;Walk-in shower     General ADL Comments: performed grooming and bathing at sink. Used commode.  Reviewed back precautions and adls.  Wife will assist as needed  Also educated on toilet aide options     Vision         Perception     Praxis      Pertinent Vitals/Pain Pain Score: 4  Pain Location: back Pain Descriptors / Indicators:  Aching;Sore Pain Intervention(s): Limited activity within patient's tolerance;Monitored during session;Premedicated before session;Repositioned     Hand Dominance     Extremity/Trunk Assessment Upper Extremity Assessment Upper Extremity Assessment: Overall WFL for tasks assessed           Communication Communication Communication: No difficulties   Cognition Arousal/Alertness: Awake/alert Behavior During Therapy: WFL for tasks assessed/performed Overall Cognitive Status: Within Functional Limits for tasks assessed                                     General Comments       Exercises     Shoulder Instructions      Home Living Family/patient expects to be discharged to:: Private residence Living Arrangements: Spouse/significant other Available Help at Discharge: Family               Bathroom Shower/Tub: Walk-in Corporate treasurer Toilet: Handicapped height     Home Equipment: None          Prior Functioning/Environment Level of Independence: Independent                 OT Problem List:        OT Treatment/Interventions:  OT Goals(Current goals can be found in the care plan section) Acute Rehab OT Goals Patient Stated Goal: Regain IND  OT Goal Formulation: All assessment and education complete, DC therapy  OT Frequency:     Barriers to D/C:            Co-evaluation              AM-PAC PT "6 Clicks" Daily Activity     Outcome Measure Help from another person eating meals?: None Help from another person taking care of personal grooming?: A Little Help from another person toileting, which includes using toliet, bedpan, or urinal?: A Little Help from another person bathing (including washing, rinsing, drying)?: A Lot Help from another person to put on and taking off regular upper body clothing?: A Little Help from another person to put on and taking off regular lower body clothing?: A Lot 6 Click Score: 17   End of  Session    Activity Tolerance: Patient tolerated treatment well Patient left: in chair;with call bell/phone within reach  OT Visit Diagnosis: Muscle weakness (generalized) (M62.81)                Time: 8003-4917 OT Time Calculation (min): 25 min Charges:  OT General Charges $OT Visit: 1 Visit OT Evaluation $OT Eval Low Complexity: 1 Low OT Treatments $Self Care/Home Management : 8-22 mins G-Codes:     [place], OTR/L [phone] 02/24/2017  Dejuana Weist 02/24/2017, 9:35 AM

## 2017-02-24 NOTE — Progress Notes (Signed)
Physical Therapy Treatment Patient Details Name: Phillip Holloway MRN: [id number] DOB: March 19, 1951 Today's Date: 02/24/2017    History of Present Illness Pt s/p L3-4, L4-5 decompressive laminectomy with dural leak and with hx of DM    PT Comments    Pt motivated, progressing well, and eager for dc home.   Follow Up Recommendations  No PT follow up     Equipment Recommendations  Other (comment)    Recommendations for Other Services OT consult     Precautions / Restrictions Precautions Precautions: Back Precaution Booklet Issued: Yes (comment) Precaution Comments: Pt recalls 2/3 back precautions without cues Restrictions Weight Bearing Restrictions: No    Mobility  Bed Mobility Overal bed mobility: Needs Assistance Bed Mobility: Supine to Sit     Supine to sit: Min guard;Supervision     General bed mobility comments: min cues for technique  Transfers Overall transfer level: Needs assistance Equipment used: Rolling walker (2 wheeled) Transfers: Sit to/from Stand Sit to Stand: Min guard         General transfer comment: cues for transition position, adherence to back precautions and use of UEs to self assist  Ambulation/Gait Ambulation/Gait assistance: Min guard;Supervision Ambulation Distance (Feet): 400 Feet Assistive device: Rolling walker (2 wheeled);None Gait Pattern/deviations: Step-through pattern;Decreased step length - right;Decreased step length - left;Shuffle Gait velocity: decr   General Gait Details: Pt ambulated 200' with RW and additional 200' sans AD - pt demonstrating good stability and safety awareness throughout   Stairs Stairs: Yes   Stair Management: One rail Left;Forwards;Alternating pattern Number of Stairs: 3    Wheelchair Mobility    Modified Rankin (Stroke Patients Only)       Balance Overall balance assessment: Independent                                          Cognition Arousal/Alertness:  Awake/alert Behavior During Therapy: WFL for tasks assessed/performed Overall Cognitive Status: Within Functional Limits for tasks assessed                                        Exercises      General Comments        Pertinent Vitals/Pain Pain Assessment: 0-10 Pain Score: 4  Pain Location: back Pain Descriptors / Indicators: Aching;Sore Pain Intervention(s): Limited activity within patient's tolerance;Monitored during session;Premedicated before session    Home Living Family/patient expects to be discharged to:: Private residence Living Arrangements: Spouse/significant other Available Help at Discharge: Family         Home Equipment: None      Prior Function Level of Independence: Independent          PT Goals (current goals can now be found in the care plan section) Acute Rehab PT Goals Patient Stated Goal: Regain IND  PT Goal Formulation: With patient Time For Goal Achievement: 02/25/17 Potential to Achieve Goals: Good Progress towards PT goals: Progressing toward goals    Frequency    7X/week      PT Plan Current plan remains appropriate    Co-evaluation              AM-PAC PT "6 Clicks" Daily Activity  Outcome Measure  Difficulty turning over in bed (including adjusting bedclothes, sheets and blankets)?: A Little Difficulty moving from lying on back  to sitting on the side of the bed? : A Little Difficulty sitting down on and standing up from a chair with arms (e.g., wheelchair, bedside commode, etc,.)?: A Little Help needed moving to and from a bed to chair (including a wheelchair)?: A Little Help needed walking in hospital room?: A Little Help needed climbing 3-5 steps with a railing? : A Little 6 Click Score: 18    End of Session   Activity Tolerance: Patient tolerated treatment well Patient left: in chair;with call bell/phone within reach Nurse Communication: Mobility status PT Visit Diagnosis: Difficulty in walking,  not elsewhere classified (R26.2)     Time: 9628-3662 PT Time Calculation (min) (ACUTE ONLY): 16 min  Charges:  $Gait Training: 8-22 mins                    G Codes:       [phone]    Lania Zawistowski 02/24/2017, 9:57 AM

## 2017-02-24 NOTE — Care Management Note (Signed)
Case Management Note  Patient Details  Name: Phillip Holloway MRN: [id number] Date of Birth: 12-07-1950  Subjective/Objective: PT no f/u.No DME orders.No CM needs.                   Action/Plan:d/c home.   Expected Discharge Date:  02/24/17               Expected Discharge Plan:  Home/Self Care  In-House Referral:     Discharge planning Services  CM Consult  Post Acute Care Choice:    Choice offered to:     DME Arranged:    DME Agency:     HH Arranged:    HH Agency:     Status of Service:  Completed, signed off  If discussed at [employer] of Stay Meetings, dates discussed:    Additional Comments:  Dessa Phi, RN 02/24/2017, 10:34 AM

## 2017-02-24 NOTE — Progress Notes (Signed)
Pt given discharge instructions and gone over with him. Answered any questions that he had. Gave him guaze and tape to take home with him for dressing changes. Just waiting on wife to arrive to take him home. Is in no distress and ready for discharge.

## 2017-02-24 NOTE — Progress Notes (Signed)
   Subjective: 2 Days Post-Op Procedure(s) (LRB): Decompression L3-L4, L-4-L5 (N/A) Patient reports pain as mild.   Patient seen in rounds for Dr. Gladstone Lighter. Patient is well, and has had no acute complaints or problems. He denies lightheadedness, dizziness, and headache. Voiding well. Positive flatus. No BM just yet. Reports that he is feeling good and is ready to go home.   Objective: Vital signs in last 24 hours: Temp:  [98.5 F (36.9 C)-99.4 F (37.4 C)] 98.5 F (36.9 C) (10/12 0550) Pulse Rate:  [83-94] 83 (10/12 0550) Resp:  [16-17] 17 (10/12 0550) BP: (99-129)/(44-68) 102/53 (10/12 0550) SpO2:  [93 %-100 %] 98 % (10/12 0550)  Intake/Output from previous day:  Intake/Output Summary (Last 24 hours) at 02/24/17 0734 Last data filed at 02/24/17 0600  Gross per 24 hour  Intake          4336.66 ml  Output             1750 ml  Net          2586.66 ml     EXAM General - Patient is Alert and Oriented Extremity - Neurologically intact Intact pulses distally Dorsiflexion/Plantar flexion intact No cellulitis present Compartment soft Dressing/Incision - clean, dry, no drainage Motor Function - intact, moving foot and toes well on exam.   Past Medical History:  Diagnosis Date  . Arthritis   . Diabetes mellitus without complication ([place])    type 2  . Elevated PSA   . Hypertension   . Phimosis   . Sleep apnea 2004   pt states he has been dx'd w osa, but didn't follow- up  no mask pt. lost weight  . Wears glasses     Assessment/Plan: 2 Days Post-Op Procedure(s) (LRB): Decompression L3-L4, L-4-L5 (N/A) Active Problems:   Spinal stenosis, lumbar region with neurogenic claudication  Estimated body mass index is 27.31 kg/m as calculated from the following:   Height as of this encounter: 6\' 1"  (1.854 m).   Weight as of this encounter: 93.9 kg (207 lb). Advance diet  DVT Prophylaxis - SCDs Weight-Bearing as tolerated   Mr. Colson is doing well. No complications  present from dural leak. We will plan for DC home this morning. Discussed instructions regarding showering, dressing changes, BP monitoring, and follow up. Will see in the office in 2 weeks.   Ardeen Jourdain, PA-C Orthopaedic Surgery 02/24/2017, 7:34 AM

## 2017-02-27 NOTE — Discharge Summary (Signed)
Physician Discharge Summary   Patient ID: Phillip Holloway MRN: [id number] DOB/AGE: Nov 19, 1950 66 y.o.  Admit date: 02/22/2017 Discharge date: 02/24/2017  Primary Diagnosis: Lumbar spinal stenosis  Admission Diagnoses:  Past Medical History:  Diagnosis Date  . Arthritis   . Diabetes mellitus without complication ([place])    type 2  . Elevated PSA   . Hypertension   . Phimosis   . Sleep apnea 2004   pt states he has been dx'd w osa, but didn't follow- up  no mask pt. lost weight  . Wears glasses    Discharge Diagnoses:   Active Problems:   Spinal stenosis, lumbar region with neurogenic claudication  Estimated body mass index is 27.31 kg/m as calculated from the following:   Height as of this encounter: 6' 1"  (1.854 m).   Weight as of this encounter: 93.9 kg (207 lb).  Procedure:  Procedure(s) (LRB): Decompression L3-L4, L-4-L5 (N/A)   Consults: None  HPI: The patient is a 66 year old male who presented with the chief complaint of low back pain. He developed pain that radiated into the right leg as well as right leg weakness. CT myelogram showed severe spinal stenosis at L3-4, L4-5. He was not responsive to conservative treatments.   Laboratory Data: Admission on 02/22/2017, Discharged on 02/24/2017  Component Date Value Ref Range Status  . ABO/RH(D) 02/22/2017 A POS   Final  . Antibody Screen 02/22/2017 NEG   Final  . Sample Expiration 02/22/2017 02/25/2017   Final  . Glucose-Capillary 02/22/2017 193* 65 - 99 mg/dL Final  . Comment 1 02/22/2017 Notify RN   Final  . Comment 2 02/22/2017 Document in Chart   Final  . Glucose-Capillary 02/22/2017 151* 65 - 99 mg/dL Final  . Comment 1 02/22/2017 Notify RN   Final  . Comment 2 02/22/2017 Document in Chart   Final  . Glucose-Capillary 02/22/2017 119* 65 - 99 mg/dL Final  . Glucose-Capillary 02/22/2017 186* 65 - 99 mg/dL Final  . Glucose-Capillary 02/23/2017 162* 65 - 99 mg/dL Final  . Glucose-Capillary 02/23/2017 141* 65  - 99 mg/dL Final  . Glucose-Capillary 02/23/2017 166* 65 - 99 mg/dL Final  . Glucose-Capillary 02/23/2017 238* 65 - 99 mg/dL Final  . Glucose-Capillary 02/23/2017 198* 65 - 99 mg/dL Final  . Glucose-Capillary 02/24/2017 140* 65 - 99 mg/dL Final  . Glucose-Capillary 02/24/2017 141* 65 - 99 mg/dL Final  Hospital Outpatient Visit on 02/17/2017  Component Date Value Ref Range Status  . ABO/RH(D) 02/17/2017 A POS   Final  . Antibody Screen 02/17/2017 NEG   Final  . Sample Expiration 02/17/2017 02/20/2017   Final  . Extend sample reason 02/17/2017 NO TRANSFUSIONS OR PREGNANCY IN THE PAST 3 MONTHS   Final  . Sodium 02/17/2017 141  135 - 145 mmol/L Final  . Potassium 02/17/2017 4.4  3.5 - 5.1 mmol/L Final  . Chloride 02/17/2017 104  101 - 111 mmol/L Final  . CO2 02/17/2017 28  22 - 32 mmol/L Final  . Glucose, Bld 02/17/2017 261* 65 - 99 mg/dL Final  . BUN 02/17/2017 25* 6 - 20 mg/dL Final  . Creatinine, Ser 02/17/2017 1.42* 0.61 - 1.24 mg/dL Final  . Calcium 02/17/2017 9.8  8.9 - 10.3 mg/dL Final  . GFR calc non Af Amer 02/17/2017 51* >60 mL/min Final  . GFR calc Af Amer 02/17/2017 59* >60 mL/min Final   Comment: (NOTE) The eGFR has been calculated using the CKD EPI equation. This calculation has not been validated in  all clinical situations. eGFR's persistently <60 mL/min signify possible Chronic Kidney Disease.   . Anion gap 02/17/2017 9  5 - 15 Final  . WBC 02/17/2017 10.7* 4.0 - 10.5 K/uL Final  . RBC 02/17/2017 4.75  4.22 - 5.81 MIL/uL Final  . Hemoglobin 02/17/2017 14.3  13.0 - 17.0 g/dL Final  . HCT 02/17/2017 40.8  39.0 - 52.0 % Final  . MCV 02/17/2017 85.9  78.0 - 100.0 fL Final  . MCH 02/17/2017 30.1  26.0 - 34.0 pg Final  . MCHC 02/17/2017 35.0  30.0 - 36.0 g/dL Final  . RDW 02/17/2017 12.8  11.5 - 15.5 % Final  . Platelets 02/17/2017 188  150 - 400 K/uL Final  . Hgb A1c MFr Bld 02/17/2017 9.5* 4.8 - 5.6 % Final   Comment: (NOTE) Pre diabetes:          5.7%-6.4% Diabetes:               >6.4% Glycemic control for   <7.0% adults with diabetes   . Mean Plasma Glucose 02/17/2017 225.95  mg/dL Final   Performed at [facility] [address] [place], [place] [address]  . Glucose-Capillary 02/17/2017 265* 65 - 99 mg/dL Final  . MRSA, PCR 02/17/2017 NEGATIVE  NEGATIVE Final  . Staphylococcus aureus 02/17/2017 NEGATIVE  NEGATIVE Final   Comment: (NOTE) The Xpert SA Assay (FDA approved for NASAL specimens in patients 57 years of age and older), is one component of a comprehensive surveillance program. It is not intended to diagnose infection nor to guide or monitor treatment.      X-Rays:Dg Lumbar Spine 2-3 Views  Result Date: 02/17/2017 CLINICAL DATA:  Preop for lumbar spine surgery. EXAM: LUMBAR SPINE - 2-3 VIEW COMPARISON:  MRI of the lumbar spine 12/29/2016 at [facility] and lumbar myelogram 01/13/2017. FINDINGS: Transitional anatomy is present. This was well described on the previous myelogram. The same numbering system is used. Rudimentary ribs are present at the transitional T12 segment. No thoracic imaging is available for comparison. The L4-5 level is labeled to correspond with the myelogram. IMPRESSION: Lumbar spine labeled to correspond with recent myelogram. Degenerate disc and facet disease is greatest at L4-5 Electronically Signed   By: San Morelle M.D.   On: 02/17/2017 16:16   Dg Spine Portable 1 View  Result Date: 02/22/2017 CLINICAL DATA:  Intraoperative localization film for patient undergoing lumbar surgery. EXAM: PORTABLE SPINE - 1 VIEW COMPARISON:  Intraoperative localization films earlier today plain films lumbar spine 02/17/2017. FINDINGS: Metallic probe is in place at the level of the inferior aspect of the L4 pedicles. IMPRESSION: Localization as above. Electronically Signed   By: Inge Rise M.D.   On: 02/22/2017 10:16   Dg Spine Portable 1 View  Result Date: 02/22/2017 CLINICAL DATA:  Lumbar spine surgery.  EXAM: PORTABLE SPINE - 1 VIEW COMPARISON:  02/22/2017 at 0828 hours FINDINGS: A single lateral intraoperative radiograph of the lower lumbar spine is provided. L5 is considered largely sacralized as on the earlier study. Surgical clamps project over the tips of the L3 and L4 spinous processes. IMPRESSION: Intraoperative localization as above. Electronically Signed   By: Logan Bores M.D.   On: 02/22/2017 09:31   Dg Spine Portable 1 View  Result Date: 02/22/2017 CLINICAL DATA:  Surgical level L4-5,  Possible L3-4.  Please # EXAM: PORTABLE SPINE - 1 VIEW COMPARISON:  Lumbar myelogram CT 01/13/2017. Lumbar spine radiographs 02/17/2017. FINDINGS: In keeping with the prior studies, there is  transitional lumbosacral anatomy with a nearly fully sacralized L5 segment. There are 2 posterior localizing needles, 1 inferior to the L4 spinous process and the other between the L3 and L4 spinous processes. IMPRESSION: Intraoperative localization views as described. Electronically Signed   By: Richardean Sale M.D.   On: 02/22/2017 09:12    EKG: Orders placed or performed in visit on 08/18/15  . EKG 12-Lead     Hospital Course: Phillip Holloway is a 66 y.o. who was admitted to [facility]. They were brought to the operating room on 02/22/2017 and underwent Procedure(s): Decompression L3-L4, L-4-L5.  Patient tolerated the procedure well and was later transferred to the recovery room and then to the orthopaedic floor for postoperative care.  They were given PO and IV analgesics for pain control following their surgery.  They were given 24 hours of postoperative antibiotics of  Anti-infectives    Start     Dose/Rate Route Frequency Ordered Stop   02/22/17 1400  ceFAZolin (ANCEF) IVPB 1 g/50 mL premix     1 g 100 mL/hr over 30 Minutes Intravenous Every 8 hours 02/22/17 1224 02/23/17 0630   02/22/17 0949  polymyxin B 500,000 Units, bacitracin 50,000 Units in sodium chloride 0.9 % 500 mL irrigation  Status:   Discontinued       As needed 02/22/17 0949 02/22/17 1102   02/22/17 0801  ceFAZolin (ANCEF) 2-4 GM/100ML-% IVPB    Comments:  [place], Bella Kennedy   : cabinet override      02/22/17 0801 02/22/17 0835   02/22/17 0634  ceFAZolin (ANCEF) IVPB 2g/100 mL premix     2 g 200 mL/hr over 30 Minutes Intravenous On call to O.R. 02/22/17 1191 02/22/17 0835     and started on DVT prophylaxis in the form of Aspirin.   PT was ordered to ambulate the patient after 24 hours of bed rest due to a dural leak sustained during surgery. He remained asymptomatic from the dural leak.  Discharge planning consulted to help with postop disposition and equipment needs.  Patient had a good night on the evening of surgery.  They started to get up OOB with therapy on the afternoon of day one. Dressing was changed on day two and the incision was clean and dry.   Patient was seen in rounds and was ready to go home.   Diet: Cardiac diet and Diabetic diet Activity:WBAT Follow-up:in 2 weeks Disposition - Home Discharged Condition: stable   Discharge Instructions    Call MD / Call 911    Complete by:  As directed    If you experience chest pain or shortness of breath, CALL 911 and be transported to the hospital emergency room.  If you develope a fever above 101 F, pus (white drainage) or increased drainage or redness at the wound, or calf pain, call your surgeon's office.   Constipation Prevention    Complete by:  As directed    Drink plenty of fluids.  Prune juice may be helpful.  You may use a stool softener, such as Colace (over the counter) 100 mg twice a day.  Use MiraLax (over the counter) for constipation as needed.   Diet - low sodium heart healthy    Complete by:  As directed    Diet Carb Modified    Complete by:  As directed    Discharge instructions    Complete by:  As directed    For the first three days, remove your dressing, and tape  a piece of saran wrap over your incision. Take your shower, then remove the  saran wrap and put a clean dressing on. After three days you can shower without the saran wrap.  No lifting or bending. No driving while taking pain medications.  Do not take blood pressure medication unless BP is 130/90. Check blood pressure 2x a day. Call Dr. Gladstone Lighter if any wound complications or temperature of 101 degrees F or over.  Call the office for an appointment to see Dr. Gladstone Lighter in two weeks: [phone] and ask for Dr. Charlestine Night nurse, Brunilda Payor.   Increase activity slowly as tolerated    Complete by:  As directed      Allergies as of 02/24/2017   No Known Allergies     Medication List    TAKE these medications   aspirin EC 81 MG tablet Take 81 mg by mouth daily.   atorvastatin 40 MG tablet Commonly known as:  LIPITOR Take 40 mg by mouth at bedtime.   BC HEADACHE POWDER PO Take 1 packet by mouth daily as needed (pain).   BENICAR HCT 40-25 MG tablet Generic drug:  olmesartan-hydrochlorothiazide Take 1 tablet by mouth daily.   D 5000 5000 units capsule Generic drug:  Cholecalciferol Take 5,000 Units by mouth daily.   HYDROcodone-acetaminophen 5-325 MG tablet Commonly known as:  NORCO/VICODIN Take 1-2 tablets by mouth every 4 (four) hours as needed (breakthrough pain).   methocarbamol 500 MG tablet Commonly known as:  ROBAXIN Take 1 tablet (500 mg total) by mouth every 6 (six) hours as needed for muscle spasms. Notes to patient:  Had dose at 3am this morning so can have again when needed, then every 6 hours as needed for pain/muscle spasms.   sitaGLIPtin 100 MG tablet Commonly known as:  JANUVIA Take 100 mg by mouth every evening.   SYNJARDY 09-998 MG Tabs Generic drug:  Empagliflozin-Metformin HCl Take 2 tablets by mouth daily.   TRESIBA FLEXTOUCH [place] Inject 28 Units into the skin every morning.      Follow-up Information    Latanya Maudlin, MD. Schedule an appointment as soon as possible for a visit in 2 week(s).   Specialty:  Orthopedic  Surgery Contact information: [address] [place] [address] [phone]           Signed: Ardeen Jourdain, PA-C Orthopaedic Surgery 02/27/2017, 10:54 AM

## 2018-06-11 ENCOUNTER — Other Ambulatory Visit: Payer: Self-pay | Admitting: Family Medicine

## 2018-06-11 DIAGNOSIS — Z87891 Personal history of nicotine dependence: Secondary | ICD-10-CM

## 2018-06-11 DIAGNOSIS — F17210 Nicotine dependence, cigarettes, uncomplicated: Secondary | ICD-10-CM

## 2018-11-07 ENCOUNTER — Other Ambulatory Visit (HOSPITAL_COMMUNITY): Payer: Self-pay | Admitting: Urology

## 2018-11-07 DIAGNOSIS — C61 Malignant neoplasm of prostate: Secondary | ICD-10-CM

## 2018-11-19 ENCOUNTER — Other Ambulatory Visit: Payer: Self-pay

## 2018-11-19 ENCOUNTER — Encounter (HOSPITAL_COMMUNITY)
Admission: RE | Admit: 2018-11-19 | Discharge: 2018-11-19 | Disposition: A | Discharge status: Home / Self Care | Payer: Medicare Other | Source: Ambulatory Visit | Attending: Urology | Admitting: Urology

## 2018-11-19 DIAGNOSIS — C61 Malignant neoplasm of prostate: Secondary | ICD-10-CM

## 2018-11-19 MED ORDER — TECHNETIUM TC 99M MEDRONATE IV KIT
20.0000 | PACK | Freq: Once | INTRAVENOUS | Status: AC | PRN
  Administered 2018-11-19: 19.5 via INTRAVENOUS

## 2018-11-27 ENCOUNTER — Encounter: Payer: Self-pay | Admitting: *Deleted

## 2018-12-11 ENCOUNTER — Ambulatory Visit: Payer: Medicare Other | Admitting: Radiation Oncology

## 2018-12-24 ENCOUNTER — Encounter: Payer: Self-pay | Admitting: Radiation Oncology

## 2018-12-24 NOTE — Progress Notes (Signed)
GU Location of Tumor / Histology: prostatic adenocarcinoma  If Prostate Cancer, Gleason Score is (4 + 3) and PSA is (10.95). Prostate volume: 64 mL.  Phillip Holloway presented for a past due wellness exam in January 2019 and found to have a PSA of 9.75. Prior PSA exam done 02/2016 was 5.66. Patient with a history of an elevated PSA and phimosis. Patient had a prior prostate biopsy that was negative for cancer. Also, patient has a history of a circumcision in Spring 2017.  Biopsies of prostate (if applicable) revealed:    Past/Anticipated interventions by urology, if any: prostate biopsy, CT scan negative, Bone scan negative, ADT (not received yet), fiducials and spaceoar (not scheduled yet)  Past/Anticipated interventions by medical oncology, if any: no  Weight changes, if any: no  Bowel/Bladder complaints, if any: IPSS 5. SHIM 20.  Reports post void dribble. Denies dysuria or hematuria.   Nausea/Vomiting, if any: no  Pain issues, if any:  Denies bone pain.   SAFETY ISSUES:  Prior radiation? no  Pacemaker/ICD? no  Possible current pregnancy? no, male patient  Is the patient on methotrexate? no  Current Complaints / other details:  68 year old male. Married with daughter. Former smoker quit 2016.   MOST INTERESTED IN RADIATION THERAPY TO AVOID CATHETERIZATION.

## 2018-12-25 ENCOUNTER — Ambulatory Visit
Admission: RE | Admit: 2018-12-25 | Discharge: 2018-12-25 | Disposition: A | Discharge status: Home / Self Care | Payer: Medicare Other | Source: Ambulatory Visit | Attending: Radiation Oncology | Admitting: Radiation Oncology

## 2018-12-25 ENCOUNTER — Encounter: Payer: Self-pay | Admitting: Radiation Oncology

## 2018-12-25 ENCOUNTER — Other Ambulatory Visit: Payer: Self-pay

## 2018-12-25 DIAGNOSIS — C61 Malignant neoplasm of prostate: Secondary | ICD-10-CM | POA: Insufficient documentation

## 2018-12-25 HISTORY — DX: Malignant neoplasm of prostate: C61

## 2018-12-25 NOTE — Progress Notes (Signed)
Radiation Oncology         [phone] ________________________________  Initial Outpatient Consultation - Conducted via MyChart Video due to current COVID-19 concerns for limiting patient exposure  Name: Phillip Holloway MRN: [id number]  Date: 12/25/2018  DOB: 1950-07-01  YJ:[id number], Dibas, MD  Irine Seal, MD   REFERRING PHYSICIAN: Irine Seal, MD  DIAGNOSIS: 68 y.o. gentleman with Stage T1c adenocarcinoma of the prostate with Gleason score of 4+3, and PSA of 10.95.    ICD-10-CM   1. Malignant neoplasm of prostate ([place])  C61     HISTORY OF PRESENT ILLNESS: Phillip Holloway is a 68 y.o. male with a diagnosis of prostate cancer. He has a history of elevated PSA with a prior negative prostate biopsy in April 2017 when his PSA was 6.53.  A repeat PSA in 02/2016 was decreased at 5.66 and DRE remained without concerning findings so the patient was released back to his PCP for continued monitoring. He was noted to have a PSA of 9.75 in January 2019 during an overdue wellness examination by his primary care physician, Dr. Lujean Amel.  Accordingly, he was referred back for follow-up in urology by Dr. Jeffie Pollock on 06/08/17, and digital rectal examination was performed revealing no prostate nodules. Repeat PSA at that time was stable, at 9.05 so the decision was made to continue to closely monitor with plans for follow up PSA in 6 months.  However, the patient did not follow up for repeat PSA until January 2020.  The PSA at that time was elevated further to 10.95 but he did not follow up with Dr. Jeffie Pollock until 08/15/2018 to review results.  After further discussion, the patient proceeded to transrectal ultrasound with 12 biopsies of the prostate on 10/18/2018. The prostate volume measured 64.7 cc.  Out of 12 core biopsies, 5 were positive, all on the left lobe prostate.  The maximum Gleason score was 4+3, and this was seen in the left base lateral, left mid lateral, left apex lateral, left mid, and left  apex.  Biopsies of prostate revealed:    CT and bone scans performed 11/19/18 were negative for visceral or osseous metastatic disease.   The patient reviewed the biopsy and imaging results with his urologist and he has kindly been referred today for discussion of potential radiation treatment options.  PREVIOUS RADIATION THERAPY: No  PAST MEDICAL HISTORY:  Past Medical History:  Diagnosis Date   Arthritis    Diabetes mellitus without complication ([place])    type 2   Elevated PSA    Hypertension    Phimosis    Prostate cancer ([place])    Sleep apnea 2004   pt states he has been dx'd w [facility], but didn't follow- up  no mask pt. lost weight   Wears glasses       PAST SURGICAL HISTORY: Past Surgical History:  Procedure Laterality Date   CIRCUMCISION N/A 08/18/2015   Procedure: CIRCUMCISION WITH BIOPSY OF PENILE NEVUS ;  Surgeon: Irine Seal, MD;  Location: [facility];  Service: Urology;  Laterality: N/A;   CYST EXCISION Right    wrist   LUMBAR LAMINECTOMY/DECOMPRESSION MICRODISCECTOMY N/A 02/22/2017   Procedure: Decompression L3-L4, L-4-L5;  Surgeon: Latanya Maudlin, MD;  Location: [facility];  Service: Orthopedics;  Laterality: N/A;   PROSTATE BIOPSY N/A 08/18/2015   Procedure: PROSTATE ULTRASOUND AND BIOPSY;  Surgeon: Irine Seal, MD;  Location: [facility];  Service: Urology;  Laterality: N/A;    FAMILY HISTORY:  Family  History  Problem Relation Age of Onset   Cancer Neg Hx    Breast cancer Neg Hx    Prostate cancer Neg Hx    Pancreatic cancer Neg Hx    Colon cancer Neg Hx     SOCIAL HISTORY:  Social History   Socioeconomic History   Marital status: Married    Spouse name: Not on file   Number of children: 2   Years of education: Not on file   Highest education level: Not on file  Occupational History   Not on file  Social Needs   Financial resource strain: Not on file   Food insecurity    Worry: Not on file     Inability: Not on file   Transportation needs    Medical: Not on file    Non-medical: Not on file  Tobacco Use   Smoking status: Former Smoker    Packs/day: 0.50    Years: 44.00    Pack years: 22.00    Types: Cigarettes    Quit date: 08/17/2014    Years since quitting: 4.3   Smokeless tobacco: Never Used   Tobacco comment: smokes occasional vapors  Substance and Sexual Activity   Alcohol use: Yes    Comment: 1/q 2 months; liquor   Drug use: No   Sexual activity: Yes  Lifestyle   Physical activity    Days per week: Not on file    Minutes per session: Not on file   Stress: Not on file  Relationships   Social connections    Talks on phone: Not on file    Gets together: Not on file    Attends religious service: Not on file    Active member of club or organization: Not on file    Attends meetings of clubs or organizations: Not on file    Relationship status: Not on file   Intimate partner violence    Fear of current or ex partner: Not on file    Emotionally abused: Not on file    Physically abused: Not on file    Forced sexual activity: Not on file  Other Topics Concern   Not on file  Social History Narrative   Not on file    ALLERGIES: Patient has no known allergies.  MEDICATIONS:  Current Outpatient Medications  Medication Sig Dispense Refill   aspirin EC 81 MG tablet Take 81 mg by mouth daily.     Aspirin-Salicylamide-Caffeine (BC HEADACHE POWDER PO) Take 1 packet by mouth daily as needed (pain).     atorvastatin (LIPITOR) 40 MG tablet Take 40 mg by mouth at bedtime.     Cholecalciferol (D 5000) 5000 units capsule Take 5,000 Units by mouth daily.     glucose blood test strip OneTouch Verio test strips     Insulin Glargine (LANTUS SOLOSTAR) 100 UNIT/ML Solostar Pen Lantus Solostar U-100 Insulin 100 unit/mL (3 mL) subcutaneous pen     Insulin Pen Needle 31G X 5 MM MISC BD Ultra-Fine Nano Pen Needle 32 gauge x 5/32"      olmesartan-hydrochlorothiazide (BENICAR HCT) 40-25 MG per tablet Take 1 tablet by mouth daily.     VICTOZA 18 MG/3ML SOPN      No current facility-administered medications for this encounter.     REVIEW OF SYSTEMS:  On review of systems, the patient reports that he is doing well overall. He denies any chest pain, shortness of breath, cough, fevers, chills, night sweats, or unintended weight changes. He denies any bowel  disturbances, and denies abdominal pain, nausea or vomiting. He denies any new musculoskeletal or joint aches or pains. His IPSS was 5, indicating mild urinary symptoms with post-void dribble. He denies dysuria or hematuria. His SHIM was 20, indicating he does have mild erectile dysfunction. A complete review of systems is obtained and is otherwise negative.    PHYSICAL EXAM:  Wt Readings from Last 3 Encounters:  12/25/18 197 lb (89.4 kg)  02/22/17 207 lb (93.9 kg)  02/17/17 207 lb (93.9 kg)   Temp Readings from Last 3 Encounters:  02/24/17 98.5 F (36.9 C) (Oral)  02/17/17 98.2 F (36.8 C) (Oral)  08/18/15 97.8 F (36.6 C)   BP Readings from Last 3 Encounters:  02/24/17 126/69  02/17/17 127/73  01/13/17 139/67   Pulse Readings from Last 3 Encounters:  02/24/17 90  02/17/17 (!) 55  01/13/17 (!) 56   Pain Assessment Pain Score: 2 (related to back surgery in 2018) Pain Frequency: Intermittent Pain Loc: Back/10  In general this is a well appearing African-American male in no acute distress. He's alert and oriented x4 and appropriate throughout the examination. Cardiopulmonary assessment is negative for acute distress and he exhibits normal effort. Remainder of exam not performed in light of virtual consultation platform.  KPS = 100  100 - Normal; no complaints; no evidence of disease. 90   - Able to carry on normal activity; minor signs or symptoms of disease. 80   - Normal activity with effort; some signs or symptoms of disease. 49   - Cares for self; unable  to carry on normal activity or to do active work. 60   - Requires occasional assistance, but is able to care for most of his personal needs. 50   - Requires considerable assistance and frequent medical care. 86   - Disabled; requires special care and assistance. 56   - Severely disabled; hospital admission is indicated although death not imminent. 61   - Very sick; hospital admission necessary; active supportive treatment necessary. 10   - Moribund; fatal processes progressing rapidly. 0     - Dead  Karnofsky DA, [employer], Craver LS and Burchenal [facility] [phone]) The use of the nitrogen mustards in the palliative treatment of carcinoma: with particular reference to bronchogenic carcinoma Cancer 1 634-56  LABORATORY DATA:  Lab Results  Component Value Date   WBC 10.7 (H) 02/17/2017   HGB 14.3 02/17/2017   HCT 40.8 02/17/2017   MCV 85.9 02/17/2017   PLT 188 02/17/2017   Lab Results  Component Value Date   NA 141 02/17/2017   K 4.4 02/17/2017   CL 104 02/17/2017   CO2 28 02/17/2017   No results found for: ALT, AST, GGT, ALKPHOS, BILITOT   RADIOGRAPHY: No results found.    IMPRESSION/PLAN: 1. 68 y.o. gentleman with Stage T1c adenocarcinoma of the prostate with Gleason Score of 4+3, and PSA of 10.95. We discussed the patient's workup and outlined the nature of prostate cancer in this setting. The patient's T stage, Gleason's score, and PSA put him into the unfavorable-intermediate risk group. Accordingly, he is eligible for a variety of potential treatment options including short-term androgen deprivation therapy (ST-ADT) in combination with either brachytherapy or 5.5 weeks of external radiation. We discussed the available radiation techniques, and focused on the details and logistics and delivery. We discussed and outlined the risks, benefits, short and long-term effects associated with radiotherapy and compared and contrasted these with prostatectomy. We discussed the role of SpaceOAR in  reducing the rectal toxicity associated with radiotherapy. We also detailed the role of ADT in the treatment of unfavorable-intermediate risk prostate cancer and outlined the associated side effects that could be expected with this therapy. He was encouraged to ask questions that were answered to his stated satisfaction.  At the conclusion of our conversation the patient is adamantly not interested in any surgical procedure and therefore elects to proceed with 5.5 weeks of external beam therapy in combination with ST-ADT. He has not received his first ADT injection. We will contact [facility] to make arrangements for start of ADT and fiducial marker placement with SpaceOAR to reduce rectal toxicity from radiotherapy prior to CT simulation. We will share our discussion with Dr. Jeffie Pollock and move forward with treatment planning in anticipation of beginning IMRT in mid-October, approximately 8 weeks from start of ADT.  The patient appears to have a good understanding of his disease and our recommendations and is comfortable and in agreement with the stated plan.  This encounter was provided by telemedicine platform MyChart Video.  The patient has given verbal consent for this type of encounter and has been advised to only accept a meeting of this type in a secure network environment. The time spent during this encounter was 55 minutes. The attendants for this meeting include Tyler Pita MD, [address] PA-C, scribe [place], and patient Phillip Holloway.  During the encounter, Tyler Pita MD, Ashlyn Bruning PA-C, and scribe Freeman Caldron were located at [facility] Radiation Oncology Department.  Patient Phillip Holloway was located at home.     Nicholos Johns, PA-C    Tyler Pita, MD  [facility] Direct Dial: [phone]   Fax: [phone] [email or web address]   Skype   LinkedIn  This document serves as a record of services  personally performed by Tyler Pita, MD and Freeman Caldron, PA-C. It was created on their behalf by Rae Lips, a trained medical scribe. The creation of this record is based on the scribe's personal observations and the providers' statements to them. This document has been checked and approved by the attending providers.

## 2018-12-28 ENCOUNTER — Telehealth: Payer: Self-pay | Admitting: *Deleted

## 2018-12-28 NOTE — Telephone Encounter (Signed)
RECEIVED PHONE CALL FROM [facility] THAT THIS PATIENT WILL HAVE HIS ADT (FIRMAGON) ON Monday 12-31-18 @ 8 AM @ DR. WRENN'S OFFICE, PATIENT IS AWARE OF THIS APPT.

## 2019-01-02 ENCOUNTER — Telehealth: Payer: Self-pay | Admitting: *Deleted

## 2019-01-02 NOTE — Telephone Encounter (Signed)
CALLED PATIENT TO INFORM OF FID. MARKERS AND SPACE OAR BEING PLACED ON 02-12-19 @ [facility] AND HIS SIM ON 02-15-19 @ DR. MANNING'S OFFICE, SPOKE WITH PATIENT AND HE IS AWARE OF THESE APPTS.

## 2019-01-03 ENCOUNTER — Other Ambulatory Visit: Payer: Self-pay | Admitting: Urology

## 2019-01-03 DIAGNOSIS — C61 Malignant neoplasm of prostate: Secondary | ICD-10-CM

## 2019-01-17 ENCOUNTER — Telehealth: Payer: Self-pay | Admitting: Medical Oncology

## 2019-01-17 ENCOUNTER — Encounter: Payer: Self-pay | Admitting: Medical Oncology

## 2019-01-17 NOTE — Telephone Encounter (Signed)
Spoke with patient to introduce myself as the prostate nurse navigator and discuss my role. I was unable to meet him 8/11 when he consulted with Dr. Tammi Klippel due to visit via [facility]. He states the visit went well and has chosen ST/ADT and 5 1/2 weeks of radiation. He received Merlyn Albert 8/17 and has not experienced any side effects. The injection sites were sore and one had a small knot, but they are improving. He is scheduled for fiducial markers/SpaceOAR with Dr. Jeffie Pollock 9/29 and CT simulation 10/2. He confirmed these appointments. I gave him my telephone number and asked him to call me with questions or concerns. He voiced understanding.

## 2019-01-31 ENCOUNTER — Telehealth: Payer: Self-pay | Admitting: Medical Oncology

## 2019-02-01 ENCOUNTER — Telehealth: Payer: Self-pay | Admitting: Medical Oncology

## 2019-02-01 NOTE — Telephone Encounter (Signed)
Spoke with Deborah-wife to let her know I spoke with [employer], about patient's cancer policy. We do not file cancer policy claims, but if she will call billing ([phone]) she can request an itemized statement. Per Ailene Ravel, the statement may not be available until he has completed radiation. She voiced understanding and I asked her to call me back if she has further questions or concerns.

## 2019-02-01 NOTE — Telephone Encounter (Signed)
Phillip Holloway called stating her husband has a cancer policy and she would like for [place] to help her file claims. She will need an itemized print out. I informed her I thought she will need to file, but I will check with [facility] and call her back with an update. She voiced understanding.

## 2019-02-13 ENCOUNTER — Encounter: Payer: Self-pay | Admitting: *Deleted

## 2019-02-14 ENCOUNTER — Telehealth: Payer: Self-pay | Admitting: *Deleted

## 2019-02-14 NOTE — Telephone Encounter (Signed)
Called patient to remind of sim and MRI appt. For 02-15-19, spoke with patient and he is aware of these appts.

## 2019-02-15 ENCOUNTER — Other Ambulatory Visit: Payer: Self-pay

## 2019-02-15 ENCOUNTER — Ambulatory Visit
Admission: RE | Admit: 2019-02-15 | Discharge: 2019-02-15 | Disposition: A | Discharge status: Home / Self Care | Payer: Medicare Other | Source: Ambulatory Visit | Attending: Radiation Oncology | Admitting: Radiation Oncology

## 2019-02-15 ENCOUNTER — Ambulatory Visit (HOSPITAL_COMMUNITY)
Admission: RE | Admit: 2019-02-15 | Discharge: 2019-02-15 | Disposition: A | Discharge status: Home / Self Care | Payer: Medicare Other | Source: Ambulatory Visit | Attending: Urology | Admitting: Urology

## 2019-02-15 ENCOUNTER — Encounter: Payer: Self-pay | Admitting: Medical Oncology

## 2019-02-15 DIAGNOSIS — C61 Malignant neoplasm of prostate: Secondary | ICD-10-CM | POA: Diagnosis not present

## 2019-02-15 DIAGNOSIS — Z510 Encounter for antineoplastic radiation therapy: Secondary | ICD-10-CM | POA: Insufficient documentation

## 2019-02-19 NOTE — Progress Notes (Signed)
  Radiation Oncology         [phone] ________________________________  Name: Phillip Holloway MRN: [id number]  Date: 02/15/2019  DOB: January 11, 1951  SIMULATION AND TREATMENT PLANNING NOTE    ICD-10-CM   1. Malignant neoplasm of prostate ([place])  C61     DIAGNOSIS:  68 y.o. gentleman with Stage T1c adenocarcinoma of the prostate with Gleason score of 4+3, and PSA of 10.95.  NARRATIVE:  The patient was brought to the [facility].  Identity was confirmed.  All relevant records and images related to the planned course of therapy were reviewed.  The patient freely provided informed written consent to proceed with treatment after reviewing the details related to the planned course of therapy. The consent form was witnessed and verified by the simulation staff.  Then, the patient was set-up in a stable reproducible supine position for radiation therapy.  A vacuum lock pillow device was custom fabricated to position his legs in a reproducible immobilized position.  Then, I performed a urethrogram under sterile conditions to identify the prostatic apex.  CT images were obtained.  Surface markings were placed.  The CT images were loaded into the planning software.  Then the prostate target and avoidance structures including the rectum, bladder, bowel and hips were contoured.  Treatment planning then occurred.  The radiation prescription was entered and confirmed.  A total of one complex treatment devices was fabricated. I have requested : Intensity Modulated Radiotherapy (IMRT) is medically necessary for this case for the following reason:  Rectal sparing.Marland Kitchen  PLAN:  The patient will receive 70 Gy in 28 fractions.  ________________________________  Sheral Apley Tammi Klippel, M.D.

## 2019-02-21 DIAGNOSIS — Z510 Encounter for antineoplastic radiation therapy: Secondary | ICD-10-CM | POA: Diagnosis not present

## 2019-02-26 ENCOUNTER — Ambulatory Visit: Payer: Medicare Other

## 2019-02-27 ENCOUNTER — Ambulatory Visit
Admission: RE | Admit: 2019-02-27 | Discharge: 2019-02-27 | Disposition: A | Discharge status: Home / Self Care | Payer: Medicare Other | Source: Ambulatory Visit | Attending: Radiation Oncology | Admitting: Radiation Oncology

## 2019-02-27 ENCOUNTER — Other Ambulatory Visit: Payer: Self-pay

## 2019-02-27 DIAGNOSIS — Z510 Encounter for antineoplastic radiation therapy: Secondary | ICD-10-CM | POA: Diagnosis not present

## 2019-02-28 ENCOUNTER — Other Ambulatory Visit: Payer: Self-pay

## 2019-02-28 ENCOUNTER — Ambulatory Visit
Admission: RE | Admit: 2019-02-28 | Discharge: 2019-02-28 | Disposition: A | Discharge status: Home / Self Care | Payer: Medicare Other | Source: Ambulatory Visit | Attending: Radiation Oncology | Admitting: Radiation Oncology

## 2019-02-28 ENCOUNTER — Encounter: Payer: Self-pay | Admitting: Medical Oncology

## 2019-02-28 ENCOUNTER — Telehealth: Payer: Self-pay | Admitting: Medical Oncology

## 2019-02-28 ENCOUNTER — Ambulatory Visit: Payer: Medicare Other

## 2019-02-28 DIAGNOSIS — Z510 Encounter for antineoplastic radiation therapy: Secondary | ICD-10-CM | POA: Diagnosis not present

## 2019-02-28 NOTE — Telephone Encounter (Signed)
Wife called asking about getting information for an [name]. I informed her that once radiation is completed, our billing department can print an itemized summary. She voiced understanding.

## 2019-03-01 ENCOUNTER — Other Ambulatory Visit: Payer: Self-pay

## 2019-03-01 ENCOUNTER — Ambulatory Visit: Payer: Medicare Other

## 2019-03-01 ENCOUNTER — Ambulatory Visit
Admission: RE | Admit: 2019-03-01 | Discharge: 2019-03-01 | Disposition: A | Discharge status: Home / Self Care | Payer: Medicare Other | Source: Ambulatory Visit | Attending: Radiation Oncology | Admitting: Radiation Oncology

## 2019-03-01 DIAGNOSIS — Z510 Encounter for antineoplastic radiation therapy: Secondary | ICD-10-CM | POA: Diagnosis not present

## 2019-03-04 ENCOUNTER — Other Ambulatory Visit: Payer: Self-pay

## 2019-03-04 ENCOUNTER — Ambulatory Visit
Admission: RE | Admit: 2019-03-04 | Discharge: 2019-03-04 | Disposition: A | Discharge status: Home / Self Care | Payer: Medicare Other | Source: Ambulatory Visit | Attending: Radiation Oncology | Admitting: Radiation Oncology

## 2019-03-04 DIAGNOSIS — Z510 Encounter for antineoplastic radiation therapy: Secondary | ICD-10-CM | POA: Diagnosis not present

## 2019-03-05 ENCOUNTER — Ambulatory Visit
Admission: RE | Admit: 2019-03-05 | Discharge: 2019-03-05 | Disposition: A | Discharge status: Home / Self Care | Payer: Medicare Other | Source: Ambulatory Visit | Attending: Radiation Oncology | Admitting: Radiation Oncology

## 2019-03-05 ENCOUNTER — Ambulatory Visit: Payer: Medicare Other

## 2019-03-05 ENCOUNTER — Other Ambulatory Visit: Payer: Self-pay

## 2019-03-05 DIAGNOSIS — Z510 Encounter for antineoplastic radiation therapy: Secondary | ICD-10-CM | POA: Diagnosis not present

## 2019-03-06 ENCOUNTER — Other Ambulatory Visit: Payer: Self-pay

## 2019-03-06 ENCOUNTER — Ambulatory Visit
Admission: RE | Admit: 2019-03-06 | Discharge: 2019-03-06 | Disposition: A | Discharge status: Home / Self Care | Payer: Medicare Other | Source: Ambulatory Visit | Attending: Radiation Oncology | Admitting: Radiation Oncology

## 2019-03-06 DIAGNOSIS — Z510 Encounter for antineoplastic radiation therapy: Secondary | ICD-10-CM | POA: Diagnosis not present

## 2019-03-07 ENCOUNTER — Other Ambulatory Visit: Payer: Self-pay

## 2019-03-07 ENCOUNTER — Ambulatory Visit
Admission: RE | Admit: 2019-03-07 | Discharge: 2019-03-07 | Disposition: A | Discharge status: Home / Self Care | Payer: Medicare Other | Source: Ambulatory Visit | Attending: Radiation Oncology | Admitting: Radiation Oncology

## 2019-03-07 ENCOUNTER — Ambulatory Visit: Payer: Medicare Other

## 2019-03-07 DIAGNOSIS — Z510 Encounter for antineoplastic radiation therapy: Secondary | ICD-10-CM | POA: Diagnosis not present

## 2019-03-08 ENCOUNTER — Ambulatory Visit: Payer: Medicare Other

## 2019-03-08 ENCOUNTER — Other Ambulatory Visit: Payer: Self-pay | Admitting: Radiation Oncology

## 2019-03-08 ENCOUNTER — Ambulatory Visit
Admission: RE | Admit: 2019-03-08 | Discharge: 2019-03-08 | Disposition: A | Discharge status: Home / Self Care | Payer: Medicare Other | Source: Ambulatory Visit | Attending: Radiation Oncology | Admitting: Radiation Oncology

## 2019-03-08 ENCOUNTER — Other Ambulatory Visit: Payer: Self-pay

## 2019-03-08 DIAGNOSIS — Z510 Encounter for antineoplastic radiation therapy: Secondary | ICD-10-CM | POA: Diagnosis not present

## 2019-03-08 MED ORDER — ALFUZOSIN HCL ER 10 MG PO TB24: 10.0000 mg | ORAL_TABLET | Freq: Every day | ORAL | 5 refills | Status: DC

## 2019-03-11 ENCOUNTER — Other Ambulatory Visit: Payer: Self-pay

## 2019-03-11 ENCOUNTER — Ambulatory Visit: Payer: Medicare Other

## 2019-03-11 ENCOUNTER — Ambulatory Visit
Admission: RE | Admit: 2019-03-11 | Discharge: 2019-03-11 | Disposition: A | Discharge status: Home / Self Care | Payer: Medicare Other | Source: Ambulatory Visit | Attending: Radiation Oncology | Admitting: Radiation Oncology

## 2019-03-11 DIAGNOSIS — Z510 Encounter for antineoplastic radiation therapy: Secondary | ICD-10-CM | POA: Diagnosis not present

## 2019-03-12 ENCOUNTER — Other Ambulatory Visit: Payer: Self-pay

## 2019-03-12 ENCOUNTER — Ambulatory Visit
Admission: RE | Admit: 2019-03-12 | Discharge: 2019-03-12 | Disposition: A | Discharge status: Home / Self Care | Payer: Medicare Other | Source: Ambulatory Visit | Attending: Radiation Oncology | Admitting: Radiation Oncology

## 2019-03-12 DIAGNOSIS — Z510 Encounter for antineoplastic radiation therapy: Secondary | ICD-10-CM | POA: Diagnosis not present

## 2019-03-13 ENCOUNTER — Ambulatory Visit: Payer: Medicare Other

## 2019-03-13 ENCOUNTER — Ambulatory Visit
Admission: RE | Admit: 2019-03-13 | Discharge: 2019-03-13 | Disposition: A | Discharge status: Home / Self Care | Payer: Medicare Other | Source: Ambulatory Visit | Attending: Radiation Oncology | Admitting: Radiation Oncology

## 2019-03-13 ENCOUNTER — Other Ambulatory Visit: Payer: Self-pay

## 2019-03-13 DIAGNOSIS — Z510 Encounter for antineoplastic radiation therapy: Secondary | ICD-10-CM | POA: Diagnosis not present

## 2019-03-14 ENCOUNTER — Other Ambulatory Visit: Payer: Self-pay

## 2019-03-14 ENCOUNTER — Ambulatory Visit: Payer: Medicare Other

## 2019-03-14 ENCOUNTER — Ambulatory Visit
Admission: RE | Admit: 2019-03-14 | Discharge: 2019-03-14 | Disposition: A | Discharge status: Home / Self Care | Payer: Medicare Other | Source: Ambulatory Visit | Attending: Radiation Oncology | Admitting: Radiation Oncology

## 2019-03-14 DIAGNOSIS — Z510 Encounter for antineoplastic radiation therapy: Secondary | ICD-10-CM | POA: Diagnosis not present

## 2019-03-15 ENCOUNTER — Other Ambulatory Visit: Payer: Self-pay

## 2019-03-15 ENCOUNTER — Ambulatory Visit: Payer: Medicare Other

## 2019-03-15 ENCOUNTER — Ambulatory Visit
Admission: RE | Admit: 2019-03-15 | Discharge: 2019-03-15 | Disposition: A | Discharge status: Home / Self Care | Payer: Medicare Other | Source: Ambulatory Visit | Attending: Radiation Oncology | Admitting: Radiation Oncology

## 2019-03-15 DIAGNOSIS — Z510 Encounter for antineoplastic radiation therapy: Secondary | ICD-10-CM | POA: Diagnosis not present

## 2019-03-18 ENCOUNTER — Other Ambulatory Visit: Payer: Self-pay

## 2019-03-18 ENCOUNTER — Ambulatory Visit
Admission: RE | Admit: 2019-03-18 | Discharge: 2019-03-18 | Disposition: A | Discharge status: Home / Self Care | Payer: Medicare Other | Source: Ambulatory Visit | Attending: Radiation Oncology | Admitting: Radiation Oncology

## 2019-03-18 ENCOUNTER — Ambulatory Visit: Payer: Medicare Other

## 2019-03-18 DIAGNOSIS — C61 Malignant neoplasm of prostate: Secondary | ICD-10-CM | POA: Insufficient documentation

## 2019-03-18 DIAGNOSIS — Z510 Encounter for antineoplastic radiation therapy: Secondary | ICD-10-CM | POA: Insufficient documentation

## 2019-03-19 ENCOUNTER — Other Ambulatory Visit: Payer: Self-pay

## 2019-03-19 ENCOUNTER — Ambulatory Visit
Admission: RE | Admit: 2019-03-19 | Discharge: 2019-03-19 | Disposition: A | Discharge status: Home / Self Care | Payer: Medicare Other | Source: Ambulatory Visit | Attending: Radiation Oncology | Admitting: Radiation Oncology

## 2019-03-19 ENCOUNTER — Ambulatory Visit: Payer: Medicare Other

## 2019-03-19 DIAGNOSIS — Z510 Encounter for antineoplastic radiation therapy: Secondary | ICD-10-CM | POA: Diagnosis not present

## 2019-03-20 ENCOUNTER — Other Ambulatory Visit: Payer: Self-pay

## 2019-03-20 ENCOUNTER — Ambulatory Visit
Admission: RE | Admit: 2019-03-20 | Discharge: 2019-03-20 | Disposition: A | Discharge status: Home / Self Care | Payer: Medicare Other | Source: Ambulatory Visit | Attending: Radiation Oncology | Admitting: Radiation Oncology

## 2019-03-20 ENCOUNTER — Ambulatory Visit: Payer: Medicare Other

## 2019-03-20 DIAGNOSIS — Z510 Encounter for antineoplastic radiation therapy: Secondary | ICD-10-CM | POA: Diagnosis not present

## 2019-03-21 ENCOUNTER — Ambulatory Visit
Admission: RE | Admit: 2019-03-21 | Discharge: 2019-03-21 | Disposition: A | Discharge status: Home / Self Care | Payer: Medicare Other | Source: Ambulatory Visit | Attending: Radiation Oncology | Admitting: Radiation Oncology

## 2019-03-21 ENCOUNTER — Ambulatory Visit: Payer: Medicare Other

## 2019-03-21 ENCOUNTER — Other Ambulatory Visit: Payer: Self-pay

## 2019-03-21 DIAGNOSIS — Z510 Encounter for antineoplastic radiation therapy: Secondary | ICD-10-CM | POA: Diagnosis not present

## 2019-03-22 ENCOUNTER — Other Ambulatory Visit: Payer: Self-pay

## 2019-03-22 ENCOUNTER — Ambulatory Visit
Admission: RE | Admit: 2019-03-22 | Discharge: 2019-03-22 | Disposition: A | Discharge status: Home / Self Care | Payer: Medicare Other | Source: Ambulatory Visit | Attending: Radiation Oncology | Admitting: Radiation Oncology

## 2019-03-22 DIAGNOSIS — Z510 Encounter for antineoplastic radiation therapy: Secondary | ICD-10-CM | POA: Diagnosis not present

## 2019-03-25 ENCOUNTER — Ambulatory Visit
Admission: RE | Admit: 2019-03-25 | Discharge: 2019-03-25 | Disposition: A | Discharge status: Home / Self Care | Payer: Medicare Other | Source: Ambulatory Visit | Attending: Radiation Oncology | Admitting: Radiation Oncology

## 2019-03-25 ENCOUNTER — Ambulatory Visit: Payer: Medicare Other

## 2019-03-25 ENCOUNTER — Other Ambulatory Visit: Payer: Self-pay

## 2019-03-25 DIAGNOSIS — Z510 Encounter for antineoplastic radiation therapy: Secondary | ICD-10-CM | POA: Diagnosis not present

## 2019-03-26 ENCOUNTER — Ambulatory Visit
Admission: RE | Admit: 2019-03-26 | Discharge: 2019-03-26 | Disposition: A | Discharge status: Home / Self Care | Payer: Medicare Other | Source: Ambulatory Visit | Attending: Radiation Oncology | Admitting: Radiation Oncology

## 2019-03-26 ENCOUNTER — Ambulatory Visit: Payer: Medicare Other

## 2019-03-26 ENCOUNTER — Other Ambulatory Visit: Payer: Self-pay

## 2019-03-26 DIAGNOSIS — Z510 Encounter for antineoplastic radiation therapy: Secondary | ICD-10-CM | POA: Diagnosis not present

## 2019-03-27 ENCOUNTER — Ambulatory Visit
Admission: RE | Admit: 2019-03-27 | Discharge: 2019-03-27 | Disposition: A | Discharge status: Home / Self Care | Payer: Medicare Other | Source: Ambulatory Visit | Attending: Radiation Oncology | Admitting: Radiation Oncology

## 2019-03-27 ENCOUNTER — Other Ambulatory Visit: Payer: Self-pay

## 2019-03-27 DIAGNOSIS — Z510 Encounter for antineoplastic radiation therapy: Secondary | ICD-10-CM | POA: Diagnosis not present

## 2019-03-28 ENCOUNTER — Other Ambulatory Visit: Payer: Self-pay

## 2019-03-28 ENCOUNTER — Ambulatory Visit
Admission: RE | Admit: 2019-03-28 | Discharge: 2019-03-28 | Disposition: A | Discharge status: Home / Self Care | Payer: Medicare Other | Source: Ambulatory Visit | Attending: Radiation Oncology | Admitting: Radiation Oncology

## 2019-03-28 DIAGNOSIS — Z510 Encounter for antineoplastic radiation therapy: Secondary | ICD-10-CM | POA: Diagnosis not present

## 2019-03-29 ENCOUNTER — Other Ambulatory Visit: Payer: Self-pay

## 2019-03-29 ENCOUNTER — Ambulatory Visit
Admission: RE | Admit: 2019-03-29 | Discharge: 2019-03-29 | Disposition: A | Discharge status: Home / Self Care | Payer: Medicare Other | Source: Ambulatory Visit | Attending: Radiation Oncology | Admitting: Radiation Oncology

## 2019-03-29 DIAGNOSIS — Z510 Encounter for antineoplastic radiation therapy: Secondary | ICD-10-CM | POA: Diagnosis not present

## 2019-04-01 ENCOUNTER — Ambulatory Visit: Admission: RE | Admit: 2019-04-01 | Payer: Medicare Other | Source: Ambulatory Visit

## 2019-04-01 ENCOUNTER — Other Ambulatory Visit: Payer: Self-pay

## 2019-04-02 ENCOUNTER — Ambulatory Visit
Admission: RE | Admit: 2019-04-02 | Discharge: 2019-04-02 | Disposition: A | Discharge status: Home / Self Care | Payer: Medicare Other | Source: Ambulatory Visit | Attending: Radiation Oncology | Admitting: Radiation Oncology

## 2019-04-02 ENCOUNTER — Telehealth: Payer: Self-pay | Admitting: Medical Oncology

## 2019-04-02 ENCOUNTER — Other Ambulatory Visit: Payer: Self-pay

## 2019-04-02 DIAGNOSIS — Z510 Encounter for antineoplastic radiation therapy: Secondary | ICD-10-CM | POA: Diagnosis not present

## 2019-04-02 NOTE — Telephone Encounter (Signed)
Phillip Holloway called asking how to get an itemized statement for [name]. I asked her to call [phone] and request. I asked her to call back if I can assist her further.

## 2019-04-03 ENCOUNTER — Ambulatory Visit
Admission: RE | Admit: 2019-04-03 | Discharge: 2019-04-03 | Disposition: A | Discharge status: Home / Self Care | Payer: Medicare Other | Source: Ambulatory Visit | Attending: Radiation Oncology | Admitting: Radiation Oncology

## 2019-04-03 ENCOUNTER — Other Ambulatory Visit: Payer: Self-pay

## 2019-04-03 DIAGNOSIS — Z510 Encounter for antineoplastic radiation therapy: Secondary | ICD-10-CM | POA: Diagnosis not present

## 2019-04-04 ENCOUNTER — Other Ambulatory Visit: Payer: Self-pay | Admitting: Cardiology

## 2019-04-04 ENCOUNTER — Ambulatory Visit
Admission: RE | Admit: 2019-04-04 | Discharge: 2019-04-04 | Disposition: A | Discharge status: Home / Self Care | Payer: Medicare Other | Source: Ambulatory Visit | Attending: Radiation Oncology | Admitting: Radiation Oncology

## 2019-04-04 ENCOUNTER — Other Ambulatory Visit: Payer: Self-pay

## 2019-04-04 DIAGNOSIS — Z510 Encounter for antineoplastic radiation therapy: Secondary | ICD-10-CM | POA: Diagnosis not present

## 2019-04-04 DIAGNOSIS — Z20822 Contact with and (suspected) exposure to covid-19: Secondary | ICD-10-CM

## 2019-04-05 ENCOUNTER — Ambulatory Visit
Admission: RE | Admit: 2019-04-05 | Discharge: 2019-04-05 | Disposition: A | Discharge status: Home / Self Care | Payer: Medicare Other | Source: Ambulatory Visit | Attending: Radiation Oncology | Admitting: Radiation Oncology

## 2019-04-05 ENCOUNTER — Ambulatory Visit: Payer: Medicare Other

## 2019-04-05 ENCOUNTER — Telehealth: Payer: Self-pay | Admitting: Medical Oncology

## 2019-04-05 ENCOUNTER — Other Ambulatory Visit: Payer: Self-pay

## 2019-04-05 DIAGNOSIS — Z510 Encounter for antineoplastic radiation therapy: Secondary | ICD-10-CM | POA: Diagnosis not present

## 2019-04-05 NOTE — Telephone Encounter (Signed)
Patient requested a call during his weekly follow up with Dr. Tammi Klippel. I spoke with him and he is requesting an itemized bill for his radiation treatments, for his [name]. I gave him number to hospital billing and informed him there is an option, to request an itemized statement. I asked him to call me back if he has trouble obtaining.He voiced understanding.

## 2019-04-07 ENCOUNTER — Ambulatory Visit
Admission: RE | Admit: 2019-04-07 | Discharge: 2019-04-07 | Disposition: A | Discharge status: Home / Self Care | Payer: Medicare Other | Source: Ambulatory Visit | Attending: Radiation Oncology | Admitting: Radiation Oncology

## 2019-04-07 ENCOUNTER — Encounter: Payer: Self-pay | Admitting: Radiation Oncology

## 2019-04-07 ENCOUNTER — Other Ambulatory Visit: Payer: Self-pay

## 2019-04-07 DIAGNOSIS — Z510 Encounter for antineoplastic radiation therapy: Secondary | ICD-10-CM | POA: Diagnosis not present

## 2019-04-08 LAB — NOVEL CORONAVIRUS, NAA: SARS-CoV-2, NAA: NOT DETECTED

## 2019-05-03 NOTE — Progress Notes (Incomplete)
  Radiation Oncology         [phone] ________________________________  Name: Phillip Holloway MRN: [id number]  Date: 04/07/2019  DOB: 1950/08/13  End of Treatment Note  Diagnosis:   68 y.o. gentleman with Stage T1c adenocarcinoma of the prostate with Gleason Score of 4+3, and PSA of 10.95     Indication for treatment:  Curative, Definitive Radiotherapy in combination with ADT  Radiation treatment dates:   02/27/2019 - 04/07/2019  Site/dose:   The prostate was treated to 70 Gy in 28 fractions of 2.5 Gy  Beams/energy:   The patient was treated with IMRT using volumetric arc therapy delivering 6 MV X-rays to clockwise and counterclockwise circumferential arcs with a 90 degree collimator offset to avoid dose scalloping.  Image guidance was performed with daily cone beam CT prior to each fraction to align to gold markers in the prostate and assure proper bladder and rectal fill volumes.  Immobilization was achieved with BodyFix custom mold.  Narrative: The patient tolerated radiation treatment relatively well. Towards the beginning, he reported intense pressure when needing to void, intense urgency, intermittent and weak urine stream, incomplete bladder emptying, and scant leakage. He was prescribed Uroxetrol with great relief. By the end of treatment, he reported nocturia x1-2, occasional difficulty emptying his bladder, constipation, occasional weak stream, and mild fatigue. He denied pain, dysuria or hematuria, urgency, and urinary leakage.  Plan: The patient has completed radiation treatment. He will return to radiation oncology clinic for routine followup in one month. I advised him to call or return sooner if he has any questions or concerns related to his recovery or treatment. ________________________________  Sheral Apley. Tammi Klippel, M.D.  This document serves as a record of services personally performed by Tyler Pita, MD. It was created on his behalf by Wilburn Mylar, a  trained medical scribe. The creation of this record is based on the scribe's personal observations and the provider's statements to them. This document has been checked and approved by the attending provider.

## 2019-05-05 NOTE — Progress Notes (Signed)
  Radiation Oncology         [phone] ________________________________  Name: Phillip Holloway MRN: [id number]  Date: 04/07/2019  DOB: 1951/04/15  End of Treatment Note  Diagnosis:   68 y.o. gentleman with Stage T1c adenocarcinoma of the prostate with Gleason score of 4+3, and PSA of 10.95.     Indication for treatment:  Curative, Definitive Radiotherapy       Radiation treatment dates:   02/27/19-04/07/19  Site/dose:   The prostate was treated to 70 Gy in 28 fractions of 2.5 Gy  Beams/energy:   The patient was treated with IMRT using volumetric arc therapy delivering 6 MV X-rays to clockwise and counterclockwise circumferential arcs with a 90 degree collimator offset to avoid dose scalloping.  Image guidance was performed with daily cone beam CT prior to each fraction to align to gold markers in the prostate and assure proper bladder and rectal fill volumes.  Immobilization was achieved with BodyFix custom mold.  Narrative: The patient tolerated radiation treatment relatively well.   The patient experienced some minor urinary irritation and modest fatigue.    Plan: The patient has completed radiation treatment. He will return to radiation oncology clinic for routine followup in one month. I advised him to call or return sooner if he has any questions or concerns related to his recovery or treatment. ________________________________  Sheral Apley. Tammi Klippel, M.D.

## 2019-05-15 NOTE — Progress Notes (Signed)
Radiation Oncology         [phone] ________________________________  Name: BARON STEINBACK MRN: [id number]  Date: 05/16/2019  DOB: 06/28/1950  Post Treatment Note  CC: Lujean Amel, MD  Koirala, Dibas, MD  Diagnosis:   68 y.o. gentleman with Stage T1c adenocarcinoma of the prostate with Gleason score of 4+3, and PSA of 10.95.     Interval Since Last Radiation:  5.5 weeks  02/27/19-04/07/19:  The prostate was treated to 70 Gy in 28 fractions of 2.5 Gy, concurrent with ADT  Narrative:  I spoke with the patient to conduct his routine scheduled 1 month follow up visit via telephone to spare the patient unnecessary potential exposure in the healthcare setting during the current COVID-19 pandemic.  The patient was notified in advance and gave permission to proceed with this visit format. He tolerated radiation treatment relatively well. Towards the beginning, he reported intense pressure when needing to void, intense urgency, intermittent and weak urine stream, incomplete bladder emptying, and scant leakage which was significantly improved with use of Uroxatrol. By the end of treatment, he reported nocturia x1-2, occasional difficulty emptying his bladder, constipation, occasional weak stream, and mild fatigue. He denied pain, dysuria or hematuria, urgency, and urinary leakage.                              On review of systems, the patient states that he is doing well overall.  He reports continued gradual improvement in his LUTS and specifically denies dysuria, gross hematuria, straining to void, incomplete bladder emptying or incontinence.  He continues taking Uroxatrol daily which has helped improve his flow of stream as well as the frequency/urgency.  He denies abdominal pain, nausea, vomiting or diarrhea but continues with occasional constipation which is relieved with MiraLAX as needed.  He reports a healthy appetite and is maintaining his weight.  Overall, he is quite pleased with his  progress to date.  ALLERGIES:  has No Known Allergies.  Meds: Current Outpatient Medications  Medication Sig Dispense Refill  . alfuzosin (UROXATRAL) 10 MG 24 hr tablet Take 1 tablet (10 mg total) by mouth at bedtime. 30 tablet 5  . aspirin EC 81 MG tablet Take 81 mg by mouth daily.    . Aspirin-Salicylamide-Caffeine (BC HEADACHE POWDER PO) Take 1 packet by mouth daily as needed (pain).    Marland Kitchen atorvastatin (LIPITOR) 40 MG tablet Take 40 mg by mouth at bedtime.    . Cholecalciferol (D 5000) 5000 units capsule Take 5,000 Units by mouth daily.    Marland Kitchen glucose blood test strip OneTouch Verio test strips    . Insulin Glargine (LANTUS SOLOSTAR) 100 UNIT/ML Solostar Pen Lantus Solostar U-100 Insulin 100 unit/mL (3 mL) subcutaneous pen    . Insulin Pen Needle 31G X 5 MM MISC BD Ultra-Fine Nano Pen Needle 32 gauge x 5/32"    . olmesartan-hydrochlorothiazide (BENICAR HCT) 40-25 MG per tablet Take 1 tablet by mouth daily.    Marland Kitchen VICTOZA 18 MG/3ML SOPN      No current facility-administered medications for this visit.    Physical Findings:  vitals were not taken for this visit.   /Unable to assess due to telephone follow up visit format.  Lab Findings: Lab Results  Component Value Date   WBC 10.7 (H) 02/17/2017   HGB 14.3 02/17/2017   HCT 40.8 02/17/2017   MCV 85.9 02/17/2017   PLT 188 02/17/2017     Radiographic  Findings: No results found.  Impression/Plan: 1. 68 y.o. gentleman with Stage T1c adenocarcinoma of the prostate with Gleason score of 4+3, and PSA of 10.95.   He will continue to follow up with urology for ongoing PSA determinations but currently does not have a follow up appointment scheduled with Dr. Jeffie Pollock to his knowledge.  I advised him to call the urology office if he has not heard from them in the next 1 to 2 weeks, anticipating a follow-up visit for repeat PSA in late February or early March 2021.  He understands what to expect with regards to PSA monitoring going forward. I  will look forward to following his response to treatment via correspondence with urology, and would be happy to continue to participate in his care if clinically indicated. I talked to the patient about what to expect in the future, including his risk for erectile dysfunction and rectal bleeding. I encouraged him to call or return to the office if he has any questions regarding his previous radiation or possible radiation side effects. He was comfortable with this plan and will follow up as needed.    Nicholos Johns, PA-C

## 2019-05-16 ENCOUNTER — Encounter: Payer: Self-pay | Admitting: Urology

## 2019-05-16 ENCOUNTER — Ambulatory Visit
Admission: RE | Admit: 2019-05-16 | Discharge: 2019-05-16 | Disposition: A | Discharge status: Home / Self Care | Payer: Medicare Other | Source: Ambulatory Visit | Attending: Urology | Admitting: Urology

## 2019-05-16 ENCOUNTER — Other Ambulatory Visit: Payer: Self-pay

## 2019-05-16 DIAGNOSIS — C61 Malignant neoplasm of prostate: Secondary | ICD-10-CM

## 2019-06-14 ENCOUNTER — Telehealth: Payer: Self-pay | Admitting: Medical Oncology

## 2019-06-14 NOTE — Telephone Encounter (Signed)
Spoke with Neoma Laming to let her know Ailene Ravel had left her a message to follow up on [employer]. She states she did not get the message. I gave her Meredith's direct number and she will call her to discuss. She was very appreciative of the call.

## 2019-06-24 ENCOUNTER — Telehealth: Payer: Self-pay | Admitting: Medical Oncology

## 2019-06-24 NOTE — Telephone Encounter (Signed)
Deborah-wife called stating [employer] has requested an  EOB for the cancer policy. Ailene Ravel worked to get the coding issue solved but now she needs the EOB. She has called the [employer] but asked if I can help her. I called [employer] and requested the EOB for dates of radiation treatment. I faxed to her.

## 2019-06-24 NOTE — Progress Notes (Signed)
Spoke with wife to confirm she received the EOB faxed on Friday. She states she did receive and has forwarded to [employer]. She was very appreciative of my help.

## 2019-08-12 IMAGING — CT CT L SPINE W/ CM
1 of 7 series · 6 of 14 positions shown, 8 images · non-contrast
Comparison: 12/29/2016 lumbar spine MRI from [REDACTED]

CLINICAL DATA: Low back pain. Predominantly right lower extremity
pain.
TECHNIQUE: Contiguous axial images were obtained through the Lumbar spine after
the intrathecal infusion of infusion. Coronal and sagittal
reconstructions were obtained of the axial image sets.

[Series 3: l spine soft · axial · 0.26mm/px · z∈[-275,-83]mm · 6 of 90 slices shown, 8 images]
[im 13/90  soft-tissue]
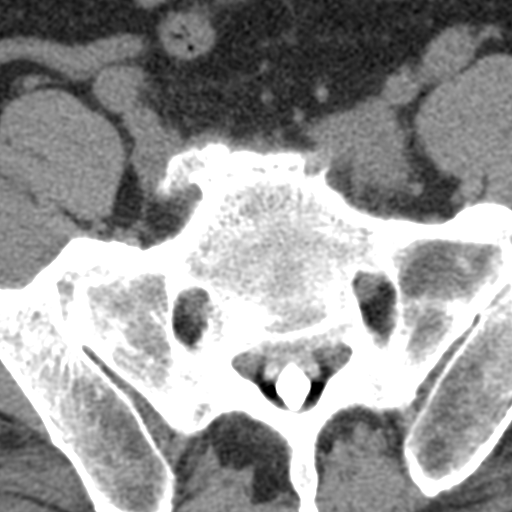
[im 13/90  bone]
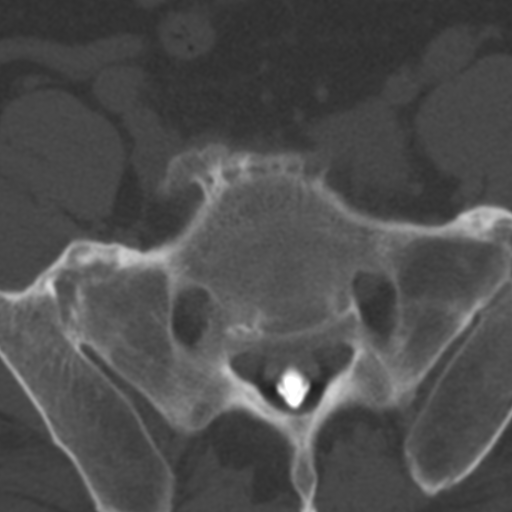
[im 26/90  bone]
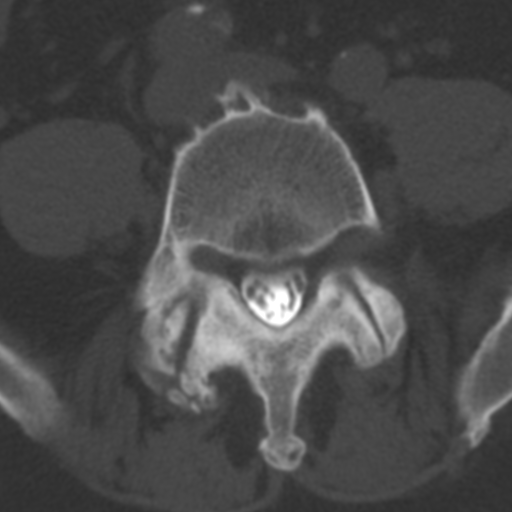
[im 39/90  bone]
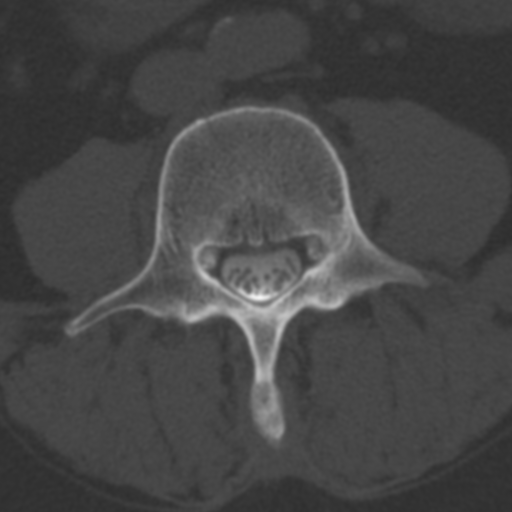
[im 51/90  bone]
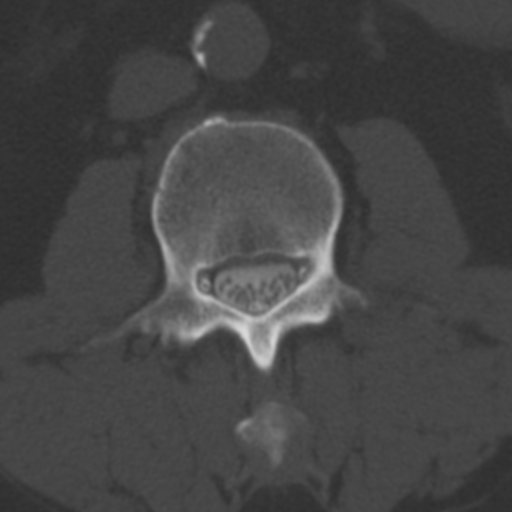
[im 64/90  soft-tissue]
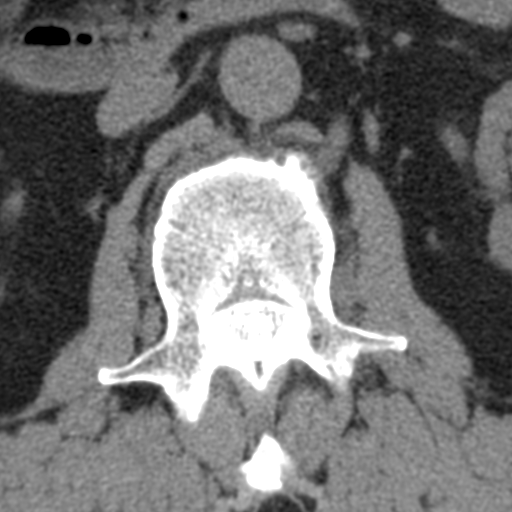
[im 64/90  bone]
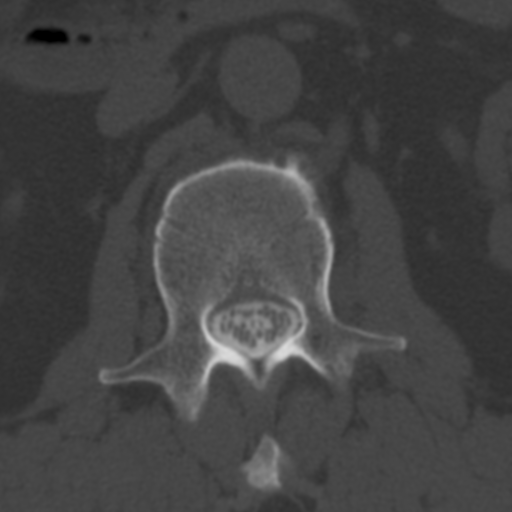
[im 77/90  bone]
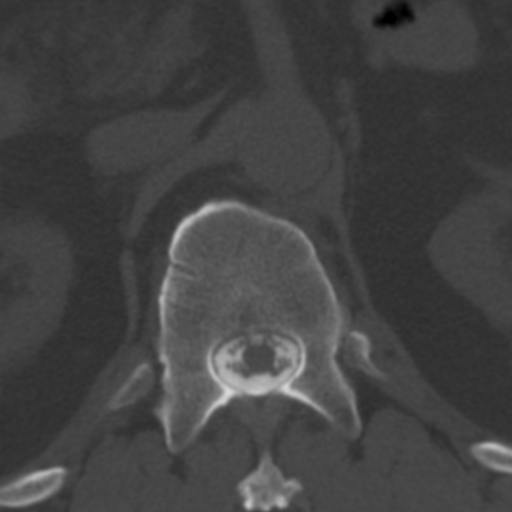

[6 of 14 positions shown; findings below may reference images not displayed]

EXAM:
LUMBAR MYELOGRAM

FLUOROSCOPY TIME:  Radiation Exposure Index (as provided by the
fluoroscopic device): 302.69 microGray*m^2

Fluoroscopy Time (in minutes and seconds):  15 seconds

PROCEDURE:
After thorough discussion of risks and benefits of the procedure
including bleeding, infection, injury to nerves, blood vessels,
adjacent structures as well as headache and CSF leak, written and
oral informed consent was obtained. Consent was obtained by Dr.
Paulus N Ceejay. Time out form was completed.

Patient was positioned prone on the fluoroscopy table. Local
anesthesia was provided with 1% lidocaine without epinephrine after
prepped and draped in the usual sterile fashion. Puncture was
performed at L2-3 using a 3 1/2 inch 22-gauge spinal needle via a
left interlaminar approach. Using a single pass through the dura,
the needle was placed within the thecal sac, with return of clear
CSF. 15 mL of Isovue-M 200 was injected into the thecal sac, with
normal opacification of the nerve roots and cauda equina consistent
with free flow within the subarachnoid space.

I personally performed the lumbar puncture and administered the
intrathecal contrast. I also personally supervised acquisition of
the myelogram images.
FINDINGS: LUMBAR MYELOGRAM FINDINGS:

There is transitional lumbosacral anatomy. No prior thoracic imaging
is available for comparison. For purposes of consistency with the
prior outside MRI report, the transitional segment will be
considered a sacralized L5 with rudimentary L5-S1 disc. The ribs at
T12 are hypoplastic.

Sagittal alignment is near anatomic without evidence of dynamic
instability on upright flexion or extension radiographs. At L4-5,
there is a ventral extradural defect with likely mild spinal
stenosis. There is also evidence of bilateral lateral recess
stenosis with cut off of the L5 nerve root sleeves. Lateral recess
stenosis is noted at L3-4 with underfilling of the L4 nerve root
sleeves bilaterally. A small ventral extradural defect is present at
L1-2 without evidence of significant stenosis.

CT LUMBAR MYELOGRAM FINDINGS:

There is trace retrolisthesis of L1 on L2 and L3 on L4. No fracture
or destructive osseous process is identified. Mild disc space
narrowing is present at L3-4 and L4-5, with vacuum disc noted at the
latter. There is bilateral SI joint ankylosis. The conus medullaris
terminates at T12-L1. Mild aortic atherosclerosis is noted without
aneurysm.

T11-12: Anterior spondylosis. No posterior disc herniation or
stenosis.

T12-L1:  Mild disc bulging without stenosis.

L1-2: Disc bulging slightly greater to the left and slight facet and
ligamentum flavum hypertrophy result in minimal to mild left lateral
recess stenosis without spinal or neural foraminal stenosis.

L2-3: Minimal disc bulging and mild facet and ligamentum flavum
hypertrophy result in minimal left lateral recess narrowing without
spinal or neural foraminal stenosis.

L3-4: Circumferential disc bulging and moderate facet and ligamentum
flavum hypertrophy result in mild to moderate bilateral lateral
recess stenosis and mild right and mild-to-moderate left neural
foraminal stenosis. No spinal stenosis.

L4-5: Circumferential disc bulging greater to the right, calcified
central disc protrusion, ligamentum flavum thickening, and severe
right greater than left facet arthrosis result in mild spinal
stenosis, severe right greater than left lateral recess stenosis,
and moderate right greater than left neural foraminal stenosis.
Potential bilateral L5 nerve root impingement in the lateral
recesses. Potential right L4 nerve impingement lateral to the
foramen.

L5-S1:  Transitional level.  No stenosis.
IMPRESSION: 1. Transitional lumbosacral anatomy as above.
2. Disc and severe facet degeneration at L4-5 with severe bilateral
lateral recess and moderate bilateral neural foraminal stenosis.
3. Mild-to-moderate lateral recess and neural foraminal stenosis at
L3-4.
4. Bilateral SI joint ankylosis.
5.  Aortic Atherosclerosis (F95G1-DEX.X).

## 2019-08-15 ENCOUNTER — Ambulatory Visit: Payer: Medicare Other | Attending: Internal Medicine

## 2019-08-15 DIAGNOSIS — Z23 Encounter for immunization: Secondary | ICD-10-CM

## 2019-08-15 NOTE — Progress Notes (Signed)
   Covid-19 Vaccination Clinic  Name:  Phillip Holloway    MRN: [id number] DOB: [birth year]  08/15/2019  [name] was observed post Covid-19 immunization for 15 minutes without incident. He was provided with Vaccine Information Sheet and instruction to access the V-Safe system.   [name] was instructed to call 911 with any severe reactions post vaccine: Marland Kitchen Difficulty breathing  . Swelling of face and throat  . A fast heartbeat  . A bad rash all over body  . Dizziness and weakness   Immunizations Administered    Name Date Dose VIS Date Route   Pfizer COVID-19 Vaccine 08/15/2019  8:26 AM 0.3 mL 04/26/2019 Intramuscular   Manufacturer: [place]   Lot: [id number]   [place]: [id number]

## 2019-09-05 ENCOUNTER — Telehealth: Payer: Self-pay | Admitting: Radiation Oncology

## 2019-09-05 NOTE — Telephone Encounter (Signed)
Received fax refill request from [facility] for patient's alfuzosin. Denied refill on the grounds the patient was released to urology in December 2020. Faxed denial back to [phone]. Fax confirmation of delivery obtained.

## 2019-09-09 ENCOUNTER — Ambulatory Visit: Payer: Medicare Other | Attending: Internal Medicine

## 2019-09-09 DIAGNOSIS — Z23 Encounter for immunization: Secondary | ICD-10-CM

## 2019-09-09 NOTE — Progress Notes (Signed)
   Covid-19 Vaccination Clinic  Name:  MCCADE STUDLEY    MRN: [id number] DOB: [birth year]  09/09/2019  Mr. Hemsworth was observed post Covid-19 immunization for 15 minutes without incident. He was provided with Vaccine Information Sheet and instruction to access the V-Safe system.   Mr. Lynes was instructed to call 911 with any severe reactions post vaccine: Marland Kitchen Difficulty breathing  . Swelling of face and throat  . A fast heartbeat  . A bad rash all over body  . Dizziness and weakness   Immunizations Administered    Name Date Dose VIS Date Route   Pfizer COVID-19 Vaccine 09/09/2019  8:08 AM 0.3 mL 07/10/2018 Intramuscular   Manufacturer: [place]   Lot: [id number]   [place]: [id number]

## 2019-09-13 ENCOUNTER — Telehealth: Payer: Self-pay | Admitting: Radiation Oncology

## 2019-09-13 NOTE — Telephone Encounter (Signed)
Received another (#6) refill request fax for patient's alfuzosin. Phoned [place] at [phone]. Left voicemail message for pharmacist explaining the refill was denied via fax on 09/05/2019 with fax confirmation of delivery. Explained the patient was released back to his urologist and should follow up with his urologist for this refill. Explained the patient hasn't been seen in this office since 04/2019. Requested to be removed from the automated fax refill request for this medication. Provided my direct number for future questions.

## 2020-05-26 ENCOUNTER — Other Ambulatory Visit: Payer: Self-pay | Admitting: Family Medicine

## 2020-05-26 DIAGNOSIS — R7401 Elevation of levels of liver transaminase levels: Secondary | ICD-10-CM

## 2020-05-26 DIAGNOSIS — R634 Abnormal weight loss: Secondary | ICD-10-CM

## 2020-05-27 ENCOUNTER — Ambulatory Visit
Admission: RE | Admit: 2020-05-27 | Discharge: 2020-05-27 | Disposition: A | Discharge status: Home / Self Care | Payer: Medicare Other | Source: Ambulatory Visit | Attending: Family Medicine | Admitting: Family Medicine

## 2020-05-27 DIAGNOSIS — R634 Abnormal weight loss: Secondary | ICD-10-CM

## 2020-05-27 DIAGNOSIS — R7401 Elevation of levels of liver transaminase levels: Secondary | ICD-10-CM

## 2020-05-27 MED ORDER — IOPAMIDOL (ISOVUE-300) INJECTION 61%
100.0000 mL | Freq: Once | INTRAVENOUS | Status: AC | PRN
  Administered 2020-05-27: 100 mL via INTRAVENOUS

## 2022-01-09 ENCOUNTER — Other Ambulatory Visit: Payer: Self-pay

## 2022-01-09 ENCOUNTER — Inpatient Hospital Stay (HOSPITAL_BASED_OUTPATIENT_CLINIC_OR_DEPARTMENT_OTHER)
Admission: EM | Admit: 2022-01-09 | Discharge: 2022-01-13 | DRG: 177 | Disposition: A | Discharge status: Home / Self Care | Payer: Medicare Other | Attending: Internal Medicine | Admitting: Internal Medicine

## 2022-01-09 ENCOUNTER — Encounter (HOSPITAL_BASED_OUTPATIENT_CLINIC_OR_DEPARTMENT_OTHER): Payer: Self-pay | Admitting: Emergency Medicine

## 2022-01-09 ENCOUNTER — Encounter (HOSPITAL_COMMUNITY): Payer: Self-pay

## 2022-01-09 ENCOUNTER — Emergency Department (HOSPITAL_BASED_OUTPATIENT_CLINIC_OR_DEPARTMENT_OTHER): Payer: Medicare Other

## 2022-01-09 ENCOUNTER — Emergency Department (HOSPITAL_BASED_OUTPATIENT_CLINIC_OR_DEPARTMENT_OTHER): Payer: Medicare Other | Admitting: Radiology

## 2022-01-09 DIAGNOSIS — G4733 Obstructive sleep apnea (adult) (pediatric): Secondary | ICD-10-CM | POA: Diagnosis present

## 2022-01-09 DIAGNOSIS — E119 Type 2 diabetes mellitus without complications: Secondary | ICD-10-CM | POA: Diagnosis not present

## 2022-01-09 DIAGNOSIS — U071 COVID-19: Secondary | ICD-10-CM | POA: Diagnosis present

## 2022-01-09 DIAGNOSIS — J9601 Acute respiratory failure with hypoxia: Secondary | ICD-10-CM | POA: Diagnosis present

## 2022-01-09 DIAGNOSIS — E1122 Type 2 diabetes mellitus with diabetic chronic kidney disease: Secondary | ICD-10-CM | POA: Diagnosis present

## 2022-01-09 DIAGNOSIS — Z8546 Personal history of malignant neoplasm of prostate: Secondary | ICD-10-CM | POA: Diagnosis not present

## 2022-01-09 DIAGNOSIS — J1282 Pneumonia due to coronavirus disease 2019: Secondary | ICD-10-CM | POA: Diagnosis present

## 2022-01-09 DIAGNOSIS — N182 Chronic kidney disease, stage 2 (mild): Secondary | ICD-10-CM | POA: Diagnosis present

## 2022-01-09 DIAGNOSIS — Z7982 Long term (current) use of aspirin: Secondary | ICD-10-CM | POA: Diagnosis not present

## 2022-01-09 DIAGNOSIS — Z888 Allergy status to other drugs, medicaments and biological substances status: Secondary | ICD-10-CM

## 2022-01-09 DIAGNOSIS — Z7984 Long term (current) use of oral hypoglycemic drugs: Secondary | ICD-10-CM | POA: Diagnosis not present

## 2022-01-09 DIAGNOSIS — I129 Hypertensive chronic kidney disease with stage 1 through stage 4 chronic kidney disease, or unspecified chronic kidney disease: Secondary | ICD-10-CM | POA: Diagnosis present

## 2022-01-09 DIAGNOSIS — I10 Essential (primary) hypertension: Secondary | ICD-10-CM | POA: Diagnosis not present

## 2022-01-09 DIAGNOSIS — J159 Unspecified bacterial pneumonia: Secondary | ICD-10-CM | POA: Diagnosis present

## 2022-01-09 DIAGNOSIS — Z794 Long term (current) use of insulin: Secondary | ICD-10-CM

## 2022-01-09 DIAGNOSIS — J189 Pneumonia, unspecified organism: Principal | ICD-10-CM

## 2022-01-09 DIAGNOSIS — R0902 Hypoxemia: Secondary | ICD-10-CM | POA: Diagnosis present

## 2022-01-09 DIAGNOSIS — Z87891 Personal history of nicotine dependence: Secondary | ICD-10-CM | POA: Diagnosis not present

## 2022-01-09 DIAGNOSIS — Z79899 Other long term (current) drug therapy: Secondary | ICD-10-CM | POA: Diagnosis not present

## 2022-01-09 LAB — CBC
HCT: 37.2 % — ABNORMAL LOW (ref 39.0–52.0)
Hemoglobin: 12.9 g/dL — ABNORMAL LOW (ref 13.0–17.0)
MCH: 29.7 pg (ref 26.0–34.0)
MCHC: 34.7 g/dL (ref 30.0–36.0)
MCV: 85.7 fL (ref 80.0–100.0)
Platelets: 264 10*3/uL (ref 150–400)
RBC: 4.34 MIL/uL (ref 4.22–5.81)
RDW: 12.1 % (ref 11.5–15.5)
WBC: 12 10*3/uL — ABNORMAL HIGH (ref 4.0–10.5)
nRBC: 0 % (ref 0.0–0.2)

## 2022-01-09 LAB — BASIC METABOLIC PANEL
Anion gap: 12 (ref 5–15)
BUN: 21 mg/dL (ref 8–23)
CO2: 22 mmol/L (ref 22–32)
Calcium: 9.8 mg/dL (ref 8.9–10.3)
Chloride: 104 mmol/L (ref 98–111)
Creatinine, Ser: 1.44 mg/dL — ABNORMAL HIGH (ref 0.61–1.24)
GFR, Estimated: 53 mL/min — ABNORMAL LOW (ref >60)
Glucose, Bld: 133 mg/dL — ABNORMAL HIGH (ref 70–99)
Potassium: 4 mmol/L (ref 3.5–5.1)
Sodium: 138 mmol/L (ref 135–145)

## 2022-01-09 LAB — CBG MONITORING, ED: Glucose-Capillary: 127 mg/dL — ABNORMAL HIGH (ref 70–99)

## 2022-01-09 LAB — D-DIMER, QUANTITATIVE: D-Dimer, Quant: 0.93 ug/mL-FEU — ABNORMAL HIGH (ref 0.00–0.50)

## 2022-01-09 MED ORDER — AMLODIPINE BESYLATE 5 MG PO TABS
5.0000 mg | ORAL_TABLET | Freq: Every day | ORAL | Status: DC
  Administered 2022-01-10 – 2022-01-13 (×4): 5 mg via ORAL
  Filled 2022-01-09 (×4): qty 1

## 2022-01-09 MED ORDER — ALBUTEROL SULFATE HFA 108 (90 BASE) MCG/ACT IN AERS
2.0000 | INHALATION_SPRAY | RESPIRATORY_TRACT | Status: DC | PRN
  Administered 2022-01-09: 2 via RESPIRATORY_TRACT
  Filled 2022-01-09: qty 6.7

## 2022-01-09 MED ORDER — SODIUM CHLORIDE 0.9 % IV SOLN
1.0000 g | Freq: Once | INTRAVENOUS | Status: AC
  Administered 2022-01-09: 1 g via INTRAVENOUS
  Filled 2022-01-09: qty 10

## 2022-01-09 MED ORDER — SODIUM CHLORIDE 0.9 % IV SOLN: INTRAVENOUS | Status: DC | PRN

## 2022-01-09 MED ORDER — IOHEXOL 350 MG/ML SOLN
100.0000 mL | Freq: Once | INTRAVENOUS | Status: AC | PRN
  Administered 2022-01-09: 80 mL via INTRAVENOUS

## 2022-01-09 MED ORDER — IRBESARTAN 150 MG PO TABS
300.0000 mg | ORAL_TABLET | Freq: Every day | ORAL | Status: DC
  Administered 2022-01-10 – 2022-01-13 (×4): 300 mg via ORAL
  Filled 2022-01-09 (×4): qty 2

## 2022-01-09 MED ORDER — ACETAMINOPHEN 325 MG PO TABS
650.0000 mg | ORAL_TABLET | Freq: Four times a day (QID) | ORAL | Status: DC | PRN
  Administered 2022-01-09: 650 mg via ORAL
  Filled 2022-01-09: qty 2

## 2022-01-09 MED ORDER — SODIUM CHLORIDE 0.9 % IV SOLN
500.0000 mg | INTRAVENOUS | Status: DC
  Administered 2022-01-10: 500 mg via INTRAVENOUS
  Filled 2022-01-09: qty 5

## 2022-01-09 MED ORDER — ENOXAPARIN SODIUM 40 MG/0.4ML IJ SOSY
40.0000 mg | PREFILLED_SYRINGE | INTRAMUSCULAR | Status: DC
  Filled 2022-01-09 (×3): qty 0.4

## 2022-01-09 MED ORDER — INSULIN ASPART 100 UNIT/ML IJ SOLN
0.0000 [IU] | Freq: Three times a day (TID) | INTRAMUSCULAR | Status: DC
  Administered 2022-01-10 (×2): 2 [IU] via SUBCUTANEOUS
  Administered 2022-01-11: 5 [IU] via SUBCUTANEOUS
  Administered 2022-01-11: 8 [IU] via SUBCUTANEOUS
  Administered 2022-01-11: 3 [IU] via SUBCUTANEOUS
  Administered 2022-01-12: 5 [IU] via SUBCUTANEOUS
  Administered 2022-01-12: 2 [IU] via SUBCUTANEOUS
  Administered 2022-01-12: 5 [IU] via SUBCUTANEOUS
  Administered 2022-01-13: 15 [IU] via SUBCUTANEOUS
  Administered 2022-01-13: 8 [IU] via SUBCUTANEOUS
  Administered 2022-01-13: 3 [IU] via SUBCUTANEOUS

## 2022-01-09 MED ORDER — ACETAMINOPHEN 650 MG RE SUPP: 650.0000 mg | Freq: Four times a day (QID) | RECTAL | Status: DC | PRN

## 2022-01-09 MED ORDER — POLYETHYLENE GLYCOL 3350 17 G PO PACK: 17.0000 g | PACK | Freq: Every day | ORAL | Status: DC | PRN

## 2022-01-09 MED ORDER — INSULIN GLARGINE-YFGN 100 UNIT/ML ~~LOC~~ SOLN
15.0000 [IU] | Freq: Every day | SUBCUTANEOUS | Status: DC
  Administered 2022-01-10 – 2022-01-13 (×4): 15 [IU] via SUBCUTANEOUS
  Filled 2022-01-09 (×4): qty 0.15

## 2022-01-09 MED ORDER — ALFUZOSIN HCL ER 10 MG PO TB24: 10.0000 mg | ORAL_TABLET | Freq: Every day | ORAL | Status: DC

## 2022-01-09 MED ORDER — SODIUM CHLORIDE 0.9 % IV SOLN
500.0000 mg | Freq: Once | INTRAVENOUS | Status: DC
  Filled 2022-01-09: qty 5

## 2022-01-09 MED ORDER — SODIUM CHLORIDE 0.9 % IV SOLN
2.0000 g | INTRAVENOUS | Status: DC
  Administered 2022-01-10 – 2022-01-13 (×4): 2 g via INTRAVENOUS
  Filled 2022-01-09 (×4): qty 20

## 2022-01-09 MED ORDER — SODIUM CHLORIDE 0.9 % IV SOLN
500.0000 mg | Freq: Once | INTRAVENOUS | Status: AC
  Administered 2022-01-09: 500 mg via INTRAVENOUS
  Filled 2022-01-09 (×2): qty 5

## 2022-01-09 MED ORDER — SODIUM CHLORIDE 0.9% FLUSH
3.0000 mL | Freq: Two times a day (BID) | INTRAVENOUS | Status: DC
  Administered 2022-01-10 – 2022-01-13 (×6): 3 mL via INTRAVENOUS

## 2022-01-09 NOTE — ED Triage Notes (Signed)
Pt had COVID, treated with antiviral, finished today. Pt still has shortness of breath with any ambulating.

## 2022-01-09 NOTE — Progress Notes (Signed)
TRH transfer acceptance note  Transferring facility: [facility] Transferring provider: Harold Barban, EDP Accepting facility: [facility], medical bed, inpatient status  71 year old male with PMH of DM, HTN, recently diagnosed with COVID-19 approximately a week ago, reportedly completed a course of antivirals (unable to see details of where he was diagnosed and what antivirals he completed), presented to ED with progressive dyspnea, worse with exertion.  This is associated with poor oral intake, generalized weakness and fatigue.  In ED transiently tachycardic to 105, afebrile, tachypneic with activity, hypoxic with oxygen saturations as low as 85 to 87 on room air per EDP's report.  D-dimer elevated.  CTA chest: No evidence of pulmonary embolism.  Suspect possible nonspecific.  Differentials infectious/inflammatory.  Recent dose of ceftriaxone azithromycin. EDP agrees that he is appropriate for a medical bed.  Will need airborne and contact isolation  Creatinine appears at baseline.  Other labs unremarkable.  Will need admit orders & H&P when he arrives at [facility].  Vernell Leep, MD,  FACP, [facility], [facility], [facility] (Care Management Physician Certified) [facility] [place]  To contact the attending provider between 7A-7P or the covering provider during after hours 7P-7A, please log into the web site [email or web address] and access using universal [place] password for that web site. If you do not have the password, please call the hospital operator.

## 2022-01-09 NOTE — H&P (Addendum)
History and Physical   Phillip Holloway XNA:[id number] DOB: 1950/10/27 DOA: 01/09/2022  PCP: Lujean Amel, MD   Patient coming from: Home  Chief Complaint: Shortness of breath  HPI: Phillip Holloway is a 71 y.o. male with medical history significant of spondylosis, prostate cancer, diabetes, hypertension presenting with a week of shortness of breath.  Patient states this symptoms initially started with a cough about 1 and half weeks ago.  He was diagnosed with COVID 6 days ago and has completed a course of antivirals, unclear what he was taking.  He has had gradual worsening shortness of breath for about the past week.  He denies fevers, chills, chest pain, abdominal pain, constipation, diarrhea, nausea, vomiting.  ED Course: Vital signs in the ED significant for blood pressure in the 542H to 062B systolic, respiratory rate in the teens to 20s, requiring 4 L to maintain saturations.  Lab work-up included BMP with creatinine of 1.44 which is near previous baseline though labs were years ago, glucose 133.  CBC with leukocytosis to 12, hemoglobin stable at 12.9.  D-dimer mildly elevated 0.93.  Imaging included chest x-ray showing extensive bilateral pneumonia versus COVID-19 pneumonitis.  CTA PE study was negative for PE but did show diffuse patchy groundglass opacities which is nonspecific representing inflammatory versus infectious and possibly pulmonary edema.  Patient received ceftriaxone and azithromycin in the ED as well as a albuterol neb.  Admission requested for acute hypoxic respiratory failure.  Review of Systems: As per HPI otherwise all other systems reviewed and are negative.  Past Medical History:  Diagnosis Date   Arthritis    Diabetes mellitus without complication ([place])    type 2   Elevated PSA    Hypertension    Phimosis    Prostate cancer [facility])    Sleep apnea 2004   pt states he has been dx'd w [facility], but didn't follow- up  no mask pt. lost weight   Wears glasses      Past Surgical History:  Procedure Laterality Date   CIRCUMCISION N/A 08/18/2015   Procedure: CIRCUMCISION WITH BIOPSY OF PENILE NEVUS ;  Surgeon: Irine Seal, MD;  Location: [facility];  Service: Urology;  Laterality: N/A;   CYST EXCISION Right    wrist   LUMBAR LAMINECTOMY/DECOMPRESSION MICRODISCECTOMY N/A 02/22/2017   Procedure: Decompression L3-L4, L-4-L5;  Surgeon: Latanya Maudlin, MD;  Location: [facility];  Service: Orthopedics;  Laterality: N/A;   PROSTATE BIOPSY N/A 08/18/2015   Procedure: PROSTATE ULTRASOUND AND BIOPSY;  Surgeon: Irine Seal, MD;  Location: [facility];  Service: Urology;  Laterality: N/A;    Social History  reports that he quit smoking about 7 years ago. His smoking use included cigarettes. He has a 22.00 pack-year smoking history. He has never used smokeless tobacco. He reports that he does not currently use alcohol. He reports that he does not use drugs.  Allergies  Allergen Reactions   Semaglutide     Family History  Problem Relation Age of Onset   Cancer Neg Hx    Breast cancer Neg Hx    Prostate cancer Neg Hx    Pancreatic cancer Neg Hx    Colon cancer Neg Hx   Reviewed on admission  Prior to Admission medications   Medication Sig Start Date End Date Taking? Authorizing Provider  alfuzosin (UROXATRAL) 10 MG 24 hr tablet Take 1 tablet (10 mg total) by mouth at bedtime. 03/08/19   Tyler Pita, MD  aspirin EC 81 MG  tablet Take 81 mg by mouth daily.    [provider]  Aspirin-Salicylamide-Caffeine (BC HEADACHE POWDER PO) Take 1 packet by mouth daily as needed (pain).    [provider]  atorvastatin (LIPITOR) 40 MG tablet Take 40 mg by mouth at bedtime.    [provider]  Cholecalciferol (D 5000) 5000 units capsule Take 5,000 Units by mouth daily.    [provider]  glucose blood test strip OneTouch Verio test strips    [provider]  Insulin Glargine (LANTUS SOLOSTAR)  100 UNIT/ML Solostar Pen Lantus Solostar U-100 Insulin 100 unit/mL (3 mL) subcutaneous pen    [provider]  Insulin Pen Needle 31G X 5 MM MISC BD Ultra-Fine Nano Pen Needle 32 gauge x 5/32"    [provider]  olmesartan (BENICAR) 40 MG tablet Take 40 mg by mouth daily.    [provider]  olmesartan-hydrochlorothiazide (BENICAR HCT) 40-25 MG per tablet Take 1 tablet by mouth daily.    [provider]  VICTOZA 18 MG/3ML SOPN  12/24/18   [provider]    Physical Exam: Vitals:   01/09/22 1600 01/09/22 1623 01/09/22 1725 01/09/22 2009  BP: (!) 140/80  (!) 142/79 136/75  Pulse: 87  92 96  Resp: (!) '22  14 16  '$ Temp:  97.9 F (36.6 C) 98.5 F (36.9 C) 98 F (36.7 C)  TempSrc:  Oral Oral Oral  SpO2: 95%  98% 95%  Weight:        Physical Exam Constitutional:      General: He is not in acute distress.    Appearance: Normal appearance.  HENT:     Head: Normocephalic and atraumatic.     Mouth/Throat:     Mouth: Mucous membranes are moist.     Pharynx: Oropharynx is clear.  Eyes:     Extraocular Movements: Extraocular movements intact.     Pupils: Pupils are equal, round, and reactive to light.  Cardiovascular:     Rate and Rhythm: Normal rate and regular rhythm.     Pulses: Normal pulses.     Heart sounds: Normal heart sounds.  Pulmonary:     Effort: Pulmonary effort is normal. No respiratory distress.     Breath sounds: Rales present.  Abdominal:     General: Bowel sounds are normal. There is no distension.     Palpations: Abdomen is soft.     Tenderness: There is no abdominal tenderness.  Musculoskeletal:        General: No swelling or deformity.  Skin:    General: Skin is warm and dry.  Neurological:     General: No focal deficit present.     Mental Status: Mental status is at baseline.    Labs on Admission: I have personally reviewed following labs and imaging studies  CBC: Recent Labs  Lab 01/09/22 1105  WBC 12.0*   HGB 12.9*  HCT 37.2*  MCV 85.7  PLT 782    Basic Metabolic Panel: Recent Labs  Lab 01/09/22 1105  NA 138  K 4.0  CL 104  CO2 22  GLUCOSE 133*  BUN 21  CREATININE 1.44*  CALCIUM 9.8    GFR: CrCl cannot be calculated (Unknown ideal weight.).  Liver Function Tests: No results for input(s): "AST", "ALT", "ALKPHOS", "BILITOT", "PROT", "ALBUMIN" in the last 168 hours.  Urine analysis:    Component Value Date/Time   COLORURINE YELLOW 06/22/2013 1237   APPEARANCEUR CLOUDY (A) 06/22/2013 1237   LABSPEC 1.023  06/22/2013 1237   PHURINE 5.5 06/22/2013 1237   GLUCOSEU NEGATIVE 06/22/2013 1237   HGBUR SMALL (A) 06/22/2013 1237   BILIRUBINUR NEGATIVE 06/22/2013 1237   KETONESUR NEGATIVE 06/22/2013 1237   PROTEINUR NEGATIVE 06/22/2013 1237   UROBILINOGEN 1.0 06/22/2013 1237   NITRITE POSITIVE (A) 06/22/2013 1237   LEUKOCYTESUR MODERATE (A) 06/22/2013 1237    Radiological Exams on Admission: CT Angio Chest PE W and/or Wo Contrast  Result Date: 01/09/2022 CLINICAL DATA:  Pulmonary embolus suspected EXAM: CT ANGIOGRAPHY CHEST WITH CONTRAST TECHNIQUE: Multidetector CT imaging of the chest was performed using the standard protocol during bolus administration of intravenous contrast. Multiplanar CT image reconstructions and MIPs were obtained to evaluate the vascular anatomy. RADIATION DOSE REDUCTION: This exam was performed according to the departmental dose-optimization program which includes automated exposure control, adjustment of the mA and/or kV according to patient size and/or use of iterative reconstruction technique. CONTRAST:  37m OMNIPAQUE IOHEXOL 350 MG/ML SOLN COMPARISON:  None Available. FINDINGS: Cardiovascular: No evidence of pulmonary embolus. Normal heart size. No pericardial effusion. Normal caliber thoracic aorta with no significant atherosclerotic disease. No coronary artery calcifications. Mediastinum/Nodes: Esophagus and thyroid are unremarkable. Mildly enlarged  mediastinal and bilateral hilar lymph nodes, likely reactive, reference right hilar lymph node measuring 1.3 cm in short axis on series 4, image 69. Lungs/Pleura: Central airways are patent. Diffuse patchy ground-glass opacities with associated smooth septal thickening. Trace bilateral pleural effusions. Upper Abdomen: Gallstones.  No acute abnormality. Musculoskeletal: No chest wall abnormality. No acute or significant osseous findings. Review of the MIP images confirms the above findings. IMPRESSION: 1. No evidence of pulmonary embolus. 2. Diffuse patchy ground-glass opacities, findings are nonspecific with differential considerations including pulmonary edema or infectious/inflammatory etiology, including COVID-19. 3. Trace bilateral pleural effusions. Electronically Signed   By: LYetta GlassmanM.D.   On: 01/09/2022 15:00   DG Chest 2 View  Result Date: 01/09/2022 CLINICAL DATA:  Shortness of breath. Diagnosed with COVID-19 5 days ago. Finished antiviral therapy today. Tachypnea when walking. EXAM: CHEST - 2 VIEW COMPARISON:  01/23/2017 FINDINGS: Normal sized heart. Interval multifocal patchy opacities throughout both lungs. No pleural fluid. Thoracic spine degenerative changes. IMPRESSION: Interval extensive bilateral pneumonia or changes due to COVID-19 pneumonitis. Electronically Signed   By: SClaudie ReveringM.D.   On: 01/09/2022 11:32    EKG: Independently reviewed.  Sinus tachycardia at 103 bpm.  Assessment/Plan Principal Problem:   Acute respiratory failure with hypoxia (HCC) Active Problems:   CAP (community acquired pneumonia)   COVID-19 virus infection   HTN (hypertension)   DM (diabetes mellitus) ([place]   Acute respiratory failure with hypoxia Community-acquired pneumonia COVID-19 infection > Patient presenting with gradually worsening shortness of breath for the past week.  Found to be hypoxic on room air into the mid 80s. > Diagnosed with COVID around a week ago after having cough  for several days prior to this.  Has had worsening shortness of breath for the past week.  Did complete a course of antiviral outpatient though we do not know what this was. > In the ED found to have leukocytosis to 12 and chest x-ray and CT chest findings concerning for multifocal pneumonia.  Given leukocytosis concern for bacterial superinfection in the setting of COVID-19 viral infection. > Received ceftriaxone azithromycin in the ED as well as an albuterol neb. - Monitor on continuous pulse ox - Continue with ceftriaxone and azithromycin - Trend fever curve and WBC - Supportive care Addendum > Antiviral he took was  molnupiravir  Diabetes > On long-acting insulin at home, but reportedly 32 units daily - 15 units long-acting daily - SSI  Hypertension - Continue home amlodipine - Replace home olmesartan with formulary irbesartan  Community-acquired pneumonia - Noted  DVT prophylaxis: Lovenox Code Status:   Full Family Communication:  Admission.  States family is up-to-date. Disposition Plan:   Patient is from:  Home  Anticipated DC to:  Home  Anticipated DC date:  2 to 4 days  Anticipated DC barriers: None  Consults called:  None Admission status:  Inpatient, MedSurg  Severity of Illness: The appropriate patient status for this patient is INPATIENT. Inpatient status is judged to be reasonable and necessary in order to provide the required intensity of service to ensure the patient's safety. The patient's presenting symptoms, physical exam findings, and initial radiographic and laboratory data in the context of their chronic comorbidities is felt to place them at high risk for further clinical deterioration. Furthermore, it is not anticipated that the patient will be medically stable for discharge from the hospital within 2 midnights of admission.   * I certify that at the point of admission it is my clinical judgment that the patient will require inpatient hospital care spanning  beyond 2 midnights from the point of admission due to high intensity of service, high risk for further deterioration and high frequency of surveillance required.Marcelyn Bruins MD [facility]  How to contact the [facility] Attending or Consulting provider [place] or covering provider during after hours [place], for this patient?   Check the care team in [facility] and look for a) attending/consulting [facility] provider listed and b) the [facility] team listed Log into [email or web address] and use [place]'s universal password to access. If you do not have the password, please contact the hospital operator. Locate the [facility] provider you are looking for under [facility] and page to a number that you can be directly reached. If you still have difficulty reaching the provider, please page the [facility] (Director on Call) for the Hospitalists listed on amion for assistance.  01/09/2022, 10:10 PM

## 2022-01-09 NOTE — Progress Notes (Signed)
Notified [facility] admits that the patient is here. Waiting on orders from the provider.

## 2022-01-09 NOTE — ED Provider Notes (Signed)
[facility] EMERGENCY DEPT Provider Note   CSN: [id number] Arrival date & time: 01/09/22  1035     History  Chief Complaint  Patient presents with   Shortness of Breath    Phillip Holloway is a 71 y.o. male with a PMHx of DM, HTN, prostate cancer, who presents to the ED complaining of increasing shortness of breath onset 1 week.  Notes that he has had a cough for the past 1.5 weeks prior to the onset of his symptoms.  Was diagnosed with COVID on 6 days ago.  Was started on antivirals with today being his last day.  Has associated decreased p.o. intake, generalized weakness, fatigue, nasal congestion, cough.  Has tried Robitussin for his symptoms.  Denies chest pain, fever, rhinorrhea, nausea.  Denies a past medical history of asthma, COPD, CHF.    The history is provided by the patient and the spouse. No language interpreter was used.       Home Medications Prior to Admission medications   Medication Sig Start Date End Date Taking? Authorizing Provider  alfuzosin (UROXATRAL) 10 MG 24 hr tablet Take 1 tablet (10 mg total) by mouth at bedtime. 03/08/19   Tyler Pita, MD  aspirin EC 81 MG tablet Take 81 mg by mouth daily.    [provider]  Aspirin-Salicylamide-Caffeine (BC HEADACHE POWDER PO) Take 1 packet by mouth daily as needed (pain).    [provider]  atorvastatin (LIPITOR) 40 MG tablet Take 40 mg by mouth at bedtime.    [provider]  Cholecalciferol (D 5000) 5000 units capsule Take 5,000 Units by mouth daily.    [provider]  glucose blood test strip OneTouch Verio test strips    [provider]  Insulin Glargine (LANTUS SOLOSTAR) 100 UNIT/ML Solostar Pen Lantus Solostar U-100 Insulin 100 unit/mL (3 mL) subcutaneous pen    [provider]  Insulin Pen Needle 31G X 5 MM MISC BD Ultra-Fine Nano Pen Needle 32 gauge x 5/32"    [provider]  olmesartan (BENICAR) 40 MG tablet Take 40 mg  by mouth daily.    [provider]  olmesartan-hydrochlorothiazide (BENICAR HCT) 40-25 MG per tablet Take 1 tablet by mouth daily.    [provider]  [place] 18 MG/3ML SOPN  12/24/18   [provider]      Allergies    Patient has no known allergies.    Review of Systems   Review of Systems  Constitutional:  Positive for fatigue. Negative for fever.  HENT:  Positive for congestion. Negative for rhinorrhea.   Respiratory:  Positive for cough and shortness of breath.   Cardiovascular:  Negative for chest pain.  Gastrointestinal:  Positive for vomiting (resolved). Negative for nausea.  All other systems reviewed and are negative.   Physical Exam Updated Vital Signs BP 134/82 (BP Location: Right Arm)   Pulse 84   Temp 98.4 F (36.9 C)   Resp (!) 24   Wt 83.8 kg   SpO2 96%   BMI 24.39 kg/m  Physical Exam Vitals and nursing note reviewed.  Constitutional:      General: He is not in acute distress.    Appearance: He is not diaphoretic.  HENT:     Head: Normocephalic and atraumatic.     Mouth/Throat:     Pharynx: No oropharyngeal exudate.  Eyes:     General: No scleral icterus.    Conjunctiva/sclera: Conjunctivae normal.  Cardiovascular:     Rate and  Rhythm: Normal rate and regular rhythm.     Pulses: Normal pulses.     Heart sounds: Normal heart sounds.  Pulmonary:     Effort: Pulmonary effort is normal. No respiratory distress.     Breath sounds: Normal breath sounds. No wheezing.  Abdominal:     General: Bowel sounds are normal.     Palpations: Abdomen is soft. There is no mass.     Tenderness: There is no abdominal tenderness. There is no guarding or rebound.  Musculoskeletal:        General: Normal range of motion.     Cervical back: Normal range of motion and neck supple.  Skin:    General: Skin is warm and dry.  Neurological:     Mental Status: He is alert.  Psychiatric:        Behavior: Behavior normal.     ED Results /  Procedures / Treatments   Labs (all labs ordered are listed, but only abnormal results are displayed) Labs Reviewed  CBC - Abnormal; Notable for the following components:      Result Value   WBC 12.0 (*)    Hemoglobin 12.9 (*)    HCT 37.2 (*)    All other components within normal limits  D-DIMER, QUANTITATIVE - Abnormal; Notable for the following components:   D-Dimer, Quant 0.93 (*)    All other components within normal limits  BASIC METABOLIC PANEL - Abnormal; Notable for the following components:   Glucose, Bld 133 (*)    Creatinine, Ser 1.44 (*)    GFR, Estimated 53 (*)    All other components within normal limits  CBG MONITORING, ED - Abnormal; Notable for the following components:   Glucose-Capillary 127 (*)    All other components within normal limits    EKG None  Radiology CT Angio Chest PE W and/or Wo Contrast  Result Date: 01/09/2022 CLINICAL DATA:  Pulmonary embolus suspected EXAM: CT ANGIOGRAPHY CHEST WITH CONTRAST TECHNIQUE: Multidetector CT imaging of the chest was performed using the standard protocol during bolus administration of intravenous contrast. Multiplanar CT image reconstructions and MIPs were obtained to evaluate the vascular anatomy. RADIATION DOSE REDUCTION: This exam was performed according to the departmental dose-optimization program which includes automated exposure control, adjustment of the mA and/or kV according to patient size and/or use of iterative reconstruction technique. CONTRAST:  70m OMNIPAQUE IOHEXOL 350 MG/ML SOLN COMPARISON:  None Available. FINDINGS: Cardiovascular: No evidence of pulmonary embolus. Normal heart size. No pericardial effusion. Normal caliber thoracic aorta with no significant atherosclerotic disease. No coronary artery calcifications. Mediastinum/Nodes: Esophagus and thyroid are unremarkable. Mildly enlarged mediastinal and bilateral hilar lymph nodes, likely reactive, reference right hilar lymph node measuring 1.3 cm in  short axis on series 4, image 69. Lungs/Pleura: Central airways are patent. Diffuse patchy ground-glass opacities with associated smooth septal thickening. Trace bilateral pleural effusions. Upper Abdomen: Gallstones.  No acute abnormality. Musculoskeletal: No chest wall abnormality. No acute or significant osseous findings. Review of the MIP images confirms the above findings. IMPRESSION: 1. No evidence of pulmonary embolus. 2. Diffuse patchy ground-glass opacities, findings are nonspecific with differential considerations including pulmonary edema or infectious/inflammatory etiology, including COVID-19. 3. Trace bilateral pleural effusions. Electronically Signed   By: LYetta GlassmanM.D.   On: 01/09/2022 15:00   DG Chest 2 View  Result Date: 01/09/2022 CLINICAL DATA:  Shortness of breath. Diagnosed with COVID-19 5 days ago. Finished antiviral therapy today. Tachypnea when walking. EXAM: CHEST - 2 VIEW COMPARISON:  01/23/2017 FINDINGS: Normal sized heart. Interval multifocal patchy opacities throughout both lungs. No pleural fluid. Thoracic spine degenerative changes. IMPRESSION: Interval extensive bilateral pneumonia or changes due to COVID-19 pneumonitis. Electronically Signed   By: Claudie Revering M.D.   On: 01/09/2022 11:32    Procedures Procedures    Medications Ordered in ED Medications  albuterol (VENTOLIN HFA) 108 (90 Base) MCG/ACT inhaler 2 puff (2 puffs Inhalation Given 01/09/22 1259)  cefTRIAXone (ROCEPHIN) 1 g in sodium chloride 0.9 % 100 mL IVPB (has no administration in time range)  azithromycin (ZITHROMAX) 500 mg in sodium chloride 0.9 % 250 mL IVPB (has no administration in time range)  iohexol (OMNIPAQUE) 350 MG/ML injection 100 mL (80 mLs Intravenous Contrast Given 01/09/22 1433)    ED Course/ Medical Decision Making/ A&P Clinical Course as of 01/09/22 1658  Sun Jan 09, 2022  1520 Discussed with patient imaging findings.  Discussed with patient plans for admission, patient  agreeable this time.  Patient appears safe for admission at this time. [SB]  1531 Ambulated patient in the room, patient still hypoxic at 86 to 88% without supplemental oxygen and symptomatic. [SB]  1557 Consult with hospitalist, Dr. Algis Liming who agrees with hospital admission at this time. [SB]    Clinical Course User Index [SB] Luellen Howson A, PA-C                           Medical Decision Making Amount and/or Complexity of Data Reviewed Labs: ordered. Radiology: ordered.  Risk Prescription drug management. Decision regarding hospitalization.   Patient presents to the ED complaining of shortness of breath onset 1 week.  Recently diagnosed with COVID and completed course of antivirals.  Initial vital signs, patient tachycardic, afebrile, hypoxic at 87% on room air.  Patient does not wear oxygen at baseline.  On exam patient with no acute cardiovascular exam findings.  Differential diagnosis includes pneumonia, pneumothorax, COPD, COVID-pneumonia.   Co morbidities that complicate the patient evaluation: Hypertension, diabetes, prostate cancer  Additional history obtained:  Additional history obtained from Spouse/Significant Other  Labs:  I ordered, and personally interpreted labs.  The pertinent results include:   CBG at 127. D-dimer elevated at 0.93. CBC with leukocytosis at 12. BMP with elevated creatinine 1.44, decreased GFR 53 (stable from previous values)  Imaging: I ordered imaging studies including chest x-ray, CTA chest I independently visualized and interpreted imaging which showed: Chest x-ray: Interval extensive bilateral pneumonia or changes due to COVID-19  pneumonitis.  CTA chest:  1. No evidence of pulmonary embolus.  2. Diffuse patchy ground-glass opacities, findings are nonspecific  with differential considerations including pulmonary edema or  infectious/inflammatory etiology, including COVID-19.  3. Trace bilateral pleural effusions.   I agree with  the radiologist interpretation  Medications:  I ordered medication including albuterol inhaler, azithromycin, Rocephin for symptom management and antibiotic treatment I have reviewed the patients home medicines and have made adjustments as needed   Consultations: I requested consultation with the Hospitalist, Dr. Algis Liming and discussed lab and imaging findings as well as pertinent plan - they recommend: admission at this time.  Disposition: Presentation suspicious for community-acquired pneumonia status post COVID.  Doubt pneumothorax or COPD at this time. After consideration of the diagnostic results and the patients response to treatment, I feel that the patient would benefit from Admission to the hospital.  Discussed with patient and wife plans for admission to the hospital.  Patient and wife agreeable at this time.  Patient appears safe for admission at this time.   This chart was dictated using voice recognition software, Dragon. Despite the best efforts of this provider to proofread and correct errors, errors may still occur which can change documentation meaning.   Final Clinical Impression(s) / ED Diagnoses Final diagnoses:  Community acquired pneumonia, unspecified laterality  Hypoxia    Rx / DC Orders ED Discharge Orders     None         Kelsye Loomer A, PA-C 01/09/22 1658    [place], [place], DO 01/10/22 [phone]

## 2022-01-10 DIAGNOSIS — J9601 Acute respiratory failure with hypoxia: Secondary | ICD-10-CM | POA: Diagnosis not present

## 2022-01-10 LAB — COMPREHENSIVE METABOLIC PANEL
ALT: 31 U/L (ref 0–44)
AST: 29 U/L (ref 15–41)
Albumin: 2.7 g/dL — ABNORMAL LOW (ref 3.5–5.0)
Alkaline Phosphatase: 86 U/L (ref 38–126)
Anion gap: 7 (ref 5–15)
BUN: 20 mg/dL (ref 8–23)
CO2: 24 mmol/L (ref 22–32)
Calcium: 8.8 mg/dL — ABNORMAL LOW (ref 8.9–10.3)
Chloride: 110 mmol/L (ref 98–111)
Creatinine, Ser: 1.38 mg/dL — ABNORMAL HIGH (ref 0.61–1.24)
GFR, Estimated: 55 mL/min — ABNORMAL LOW (ref >60)
Glucose, Bld: 68 mg/dL — ABNORMAL LOW (ref 70–99)
Potassium: 4 mmol/L (ref 3.5–5.1)
Sodium: 141 mmol/L (ref 135–145)
Total Bilirubin: 0.8 mg/dL (ref 0.3–1.2)
Total Protein: 7.3 g/dL (ref 6.5–8.1)

## 2022-01-10 LAB — HIV ANTIBODY (ROUTINE TESTING W REFLEX): HIV Screen 4th Generation wRfx: NONREACTIVE

## 2022-01-10 LAB — CBC
HCT: 37.6 % — ABNORMAL LOW (ref 39.0–52.0)
Hemoglobin: 12.4 g/dL — ABNORMAL LOW (ref 13.0–17.0)
MCH: 29.6 pg (ref 26.0–34.0)
MCHC: 33 g/dL (ref 30.0–36.0)
MCV: 89.7 fL (ref 80.0–100.0)
Platelets: 266 10*3/uL (ref 150–400)
RBC: 4.19 MIL/uL — ABNORMAL LOW (ref 4.22–5.81)
RDW: 12.4 % (ref 11.5–15.5)
WBC: 11.8 10*3/uL — ABNORMAL HIGH (ref 4.0–10.5)
nRBC: 0 % (ref 0.0–0.2)

## 2022-01-10 LAB — GLUCOSE, CAPILLARY
Glucose-Capillary: 118 mg/dL — ABNORMAL HIGH (ref 70–99)
Glucose-Capillary: 136 mg/dL — ABNORMAL HIGH (ref 70–99)
Glucose-Capillary: 365 mg/dL — ABNORMAL HIGH (ref 70–99)

## 2022-01-10 LAB — C-REACTIVE PROTEIN: CRP: 24.8 mg/dL — ABNORMAL HIGH (ref <1.0)

## 2022-01-10 LAB — BRAIN NATRIURETIC PEPTIDE: B Natriuretic Peptide: 21.7 pg/mL (ref 0.0–100.0)

## 2022-01-10 LAB — PROCALCITONIN: Procalcitonin: 0.43 ng/mL

## 2022-01-10 LAB — SARS CORONAVIRUS 2 BY RT PCR: SARS Coronavirus 2 by RT PCR: POSITIVE — AB

## 2022-01-10 MED ORDER — INSULIN ASPART 100 UNIT/ML IJ SOLN
5.0000 [IU] | Freq: Once | INTRAMUSCULAR | Status: AC
Start: 2022-01-11 — End: 2022-01-11
  Administered 2022-01-11: 5 [IU] via SUBCUTANEOUS

## 2022-01-10 MED ORDER — METHYLPREDNISOLONE SODIUM SUCC 40 MG IJ SOLR: 40.0000 mg | Freq: Two times a day (BID) | INTRAMUSCULAR | Status: DC

## 2022-01-10 MED ORDER — AZITHROMYCIN 250 MG PO TABS
500.0000 mg | ORAL_TABLET | Freq: Every day | ORAL | Status: AC
  Administered 2022-01-11 – 2022-01-12 (×2): 500 mg via ORAL
  Filled 2022-01-10 (×2): qty 2

## 2022-01-10 MED ORDER — DEXAMETHASONE 4 MG PO TABS
6.0000 mg | ORAL_TABLET | Freq: Every day | ORAL | Status: DC
  Administered 2022-01-10 – 2022-01-13 (×4): 6 mg via ORAL
  Filled 2022-01-10 (×4): qty 2

## 2022-01-10 NOTE — Progress Notes (Signed)
PROGRESS NOTE  ARZELL MCGEEHAN  MGQ:[id number] DOB: Aug 19, 1950 DOA: 01/09/2022 PCP: Lujean Amel, MD   Brief Narrative:  Patient is a 71 year old male with history of prostate cancer, diabetes type 2, hypertension, recent COVID infection who presented here from home with complaints of shortness of breath, cough for about 1 and half weeks.  He was diagnosed with COVID about 6 days ago and completed course of antivirals but not sure what he was taking.  On presentation ,he was hemodynamically stable but requiring 4 L of oxygen to maintain saturation.  Lab work showed creatinine of 1.4, WBC of 12.  X-ray /CT of the chest showed extensive multifocal bilateral pneumonia suggesting bacterial versus viral pneumonia with superimposed pulmonary edema.  Patient was admitted for the management of acute hypoxic respiratory failure secondary to multifocal pneumonia.  Started on antibiotics.  COVID test positive, started on steroids due to hypoxia.  PCCM also consulted today due to finding of extensive bilateral interstitial changes.  Assessment & Plan:  Principal Problem:   Acute respiratory failure with hypoxia (HCC) Active Problems:   CAP (community acquired pneumonia)   COVID-19 virus infection   HTN (hypertension)   DM (diabetes mellitus) ([place])  Acute hypoxic respiratory failure: Not on oxygen at home.  No history of previous lung disease.  Presented with shortness of breath, cough.  Required 4 L of oxygen on presentation to maintain saturation. Chest imaging showed action of bilateral multifocal infiltrates suggesting viral versus bacterial pneumonia. Empirically  on on antibiotics to cover for community acquired pneumonia. Patient was diagnosed with COVID about 6 days ago.  No repeat testing has been done here, COVID test ordered after admission, came out to be positive.  Patient states he took oral medication for 5 days as an outpatient, most likely Paxlovid. Continue airborne precaution. We  will try to wean the oxygen.  COVID-pneumonia/bilateral multifocal pneumonia: Could be viral but antibiotics started to cover for bacterial pneumonia.  Procalcitonin nonreassuring.  Will consider discontinue the antibiotics as soon as possible.  We will start on steroids because of hypoxia. On reviewing the CT chest, he was found to have extensive bilateral infiltrates/interstitial pattern, PCCM also consulted today. Continue to monitor inflammatory markers  Diabetes type 2: On insulin at home.  Continue sliding scale insulin here and systemically.  Monitor blood sugars  Hypertension: Continue amlodipine, irbesartan.  Blood sugars  CKD stage II: Baseline creatinine ranged from 1.3-1.4.  Currently kidney function at baseline.  Weakness: Most likely secondary to COVID illness.  We will do PT/OT when appropriate.  Lives with wife.  No problem with ambulation when healthy         DVT prophylaxis:enoxaparin (LOVENOX) injection 40 mg Start: 01/09/22 2200     Code Status: Full Code  Family Communication: None at bedside  Patient status:Inpatient  Patient is from :Home  Anticipated discharge OI:ZTIW  Estimated DC date:2-3 days   Consultants: PCCM  Procedures:None  Antimicrobials:  Anti-infectives (From admission, onward)    Start     Dose/Rate Route Frequency Ordered Stop   01/10/22 1000  cefTRIAXone (ROCEPHIN) 2 g in sodium chloride 0.9 % 100 mL IVPB        2 g 200 mL/hr over 30 Minutes Intravenous Every 24 hours 01/09/22 1834 01/15/22 0959   01/10/22 1000  azithromycin (ZITHROMAX) 500 mg in sodium chloride 0.9 % 250 mL IVPB        500 mg 250 mL/hr over 60 Minutes Intravenous Every 24 hours 01/09/22 1834 01/15/22 0959  01/09/22 1815  azithromycin (ZITHROMAX) 500 mg in sodium chloride 0.9 % 250 mL IVPB        500 mg 250 mL/hr over 60 Minutes Intravenous  Once 01/09/22 1722 01/09/22 2113   01/09/22 1245  cefTRIAXone (ROCEPHIN) 1 g in sodium chloride 0.9 % 100 mL IVPB         1 g 200 mL/hr over 30 Minutes Intravenous  Once 01/09/22 1244 01/09/22 1649   01/09/22 1245  azithromycin (ZITHROMAX) 500 mg in sodium chloride 0.9 % 250 mL IVPB  Status:  Discontinued        500 mg 250 mL/hr over 60 Minutes Intravenous  Once 01/09/22 1244 01/09/22 2113       Subjective: Seen and examined at the bedside this morning.  When I entered the room, he was lying in bed, appears weak but remains alert and oriented.  Denies any new complaints like worsening shortness of breath.  Has some cough.  On 4 L of oxygen per minute.  Speaks in full sentences.  Objective: Vitals:   01/09/22 1725 01/09/22 2009 01/09/22 2358 01/10/22 0423  BP: (!) 142/79 136/75 (!) 137/57 (!) 145/75  Pulse: 92 96 92 (!) 102  Resp: '14 16 18 16  '$ Temp: 98.5 F (36.9 C) 98 F (36.7 C) 98.9 F (37.2 C) 98.6 F (37 C)  TempSrc: Oral Oral Oral Oral  SpO2: 98% 95% 95% 93%  Weight:    82.6 kg    Intake/Output Summary (Last 24 hours) at 01/10/2022 0800 Last data filed at 01/10/2022 9381 Gross per 24 hour  Intake 56.93 ml  Output --  Net 56.93 ml   Filed Weights   01/09/22 1116 01/10/22 0423  Weight: 83.8 kg 82.6 kg    Examination:  General exam: Overall comfortable, not in distress,weak appearing  HEENT: PERRL Respiratory system: Bilateral diffuse crackles  cardiovascular system: S1 & S2 heard, RRR.  Gastrointestinal system: Abdomen is nondistended, soft and nontender. Central nervous system: Alert and oriented Extremities: No edema, no clubbing ,no cyanosis Skin: No rashes, no ulcers,no icterus     Data Reviewed: I have personally reviewed following labs and imaging studies  CBC: Recent Labs  Lab 01/09/22 1105 01/10/22 0026  WBC 12.0* 11.8*  HGB 12.9* 12.4*  HCT 37.2* 37.6*  MCV 85.7 89.7  PLT 264 829   Basic Metabolic Panel: Recent Labs  Lab 01/09/22 1105 01/10/22 0026  NA 138 141  K 4.0 4.0  CL 104 110  CO2 22 24  GLUCOSE 133* 68*  BUN 21 20  CREATININE 1.44* 1.38*   CALCIUM 9.8 8.8*     No results found for this or any previous visit (from the past 240 hour(s)).   Radiology Studies: CT Angio Chest PE W and/or Wo Contrast  Result Date: 01/09/2022 CLINICAL DATA:  Pulmonary embolus suspected EXAM: CT ANGIOGRAPHY CHEST WITH CONTRAST TECHNIQUE: Multidetector CT imaging of the chest was performed using the standard protocol during bolus administration of intravenous contrast. Multiplanar CT image reconstructions and MIPs were obtained to evaluate the vascular anatomy. RADIATION DOSE REDUCTION: This exam was performed according to the departmental dose-optimization program which includes automated exposure control, adjustment of the mA and/or kV according to patient size and/or use of iterative reconstruction technique. CONTRAST:  77m OMNIPAQUE IOHEXOL 350 MG/ML SOLN COMPARISON:  None Available. FINDINGS: Cardiovascular: No evidence of pulmonary embolus. Normal heart size. No pericardial effusion. Normal caliber thoracic aorta with no significant atherosclerotic disease. No coronary artery calcifications. Mediastinum/Nodes: Esophagus and thyroid are  unremarkable. Mildly enlarged mediastinal and bilateral hilar lymph nodes, likely reactive, reference right hilar lymph node measuring 1.3 cm in short axis on series 4, image 69. Lungs/Pleura: Central airways are patent. Diffuse patchy ground-glass opacities with associated smooth septal thickening. Trace bilateral pleural effusions. Upper Abdomen: Gallstones.  No acute abnormality. Musculoskeletal: No chest wall abnormality. No acute or significant osseous findings. Review of the MIP images confirms the above findings. IMPRESSION: 1. No evidence of pulmonary embolus. 2. Diffuse patchy ground-glass opacities, findings are nonspecific with differential considerations including pulmonary edema or infectious/inflammatory etiology, including COVID-19. 3. Trace bilateral pleural effusions. Electronically Signed   By: Yetta Glassman M.D.   On: 01/09/2022 15:00   DG Chest 2 View  Result Date: 01/09/2022 CLINICAL DATA:  Shortness of breath. Diagnosed with COVID-19 5 days ago. Finished antiviral therapy today. Tachypnea when walking. EXAM: CHEST - 2 VIEW COMPARISON:  01/23/2017 FINDINGS: Normal sized heart. Interval multifocal patchy opacities throughout both lungs. No pleural fluid. Thoracic spine degenerative changes. IMPRESSION: Interval extensive bilateral pneumonia or changes due to COVID-19 pneumonitis. Electronically Signed   By: Claudie Revering M.D.   On: 01/09/2022 11:32    Scheduled Meds:  amLODipine  5 mg Oral Daily   enoxaparin (LOVENOX) injection  40 mg Subcutaneous Q24H   insulin aspart  0-15 Units Subcutaneous TID WC   insulin glargine-yfgn  15 Units Subcutaneous Daily   irbesartan  300 mg Oral Daily   sodium chloride flush  3 mL Intravenous Q12H   Continuous Infusions:  sodium chloride Stopped (01/09/22 2210)   azithromycin     cefTRIAXone (ROCEPHIN)  IV       LOS: 1 day   Shelly Coss, MD [facility] P8/28/2023, 8:00 AM

## 2022-01-10 NOTE — Progress Notes (Signed)
  Transition of Care (TOC) Screening Note   Patient Details  Name: Phillip Holloway Date of Birth: 09-Jan-1951   Transition of [facility] CM/SW Contact:    Lennart Pall, LCSW Phone Number: 01/10/2022, 10:07 AM    Transition of Care Department [facility]) has reviewed patient and no TOC needs have been identified at this time. We will continue to monitor patient advancement through interdisciplinary progression rounds. If new patient transition needs arise, please place a TOC consult.

## 2022-01-10 NOTE — Consult Note (Signed)
NAME:  Phillip Holloway, MRN:  [id number], DOB:  [birth year], LOS: 1 ADMISSION DATE:  01/09/2022, CONSULTATION DATE: 01/10/2022 REFERRING MD: Dr. Tawanna Solo, CHIEF COMPLAINT: Shortness of breath, COVID-pneumonia  History of Present Illness:  Patient was recently diagnosed with COVID infection Came into the hospital worsening shortness of breath Has no underlying lung disease Reformed smoker quit about 4 years ago  Pertinent  Medical History   Past Medical History:  Diagnosis Date   Arthritis    Diabetes mellitus without complication ([place])    type 2   Elevated PSA    Hypertension    Phimosis    Prostate cancer ([place])    Sleep apnea 2004   pt states he has been dx'd w [facility], but didn't follow- up  no mask pt. lost weight   Wears glasses      Significant Hospital Events: Including procedures, antibiotic start and stop dates in addition to other pertinent events   CT scan of the chest-multifocal infiltrate-reviewed with the patient  Interim History / Subjective:  Feels about the same as when he came in  Objective   Blood pressure 139/75, pulse 91, temperature 98.5 F (36.9 C), temperature source Oral, resp. rate (!) 22, height 6' (1.829 m), weight 82.6 kg, SpO2 96 %.        Intake/Output Summary (Last 24 hours) at 01/10/2022 1202 Last data filed at 01/10/2022 9833 Gross per 24 hour  Intake 56.93 ml  Output --  Net 56.93 ml   Filed Weights   01/09/22 1116 01/10/22 0423  Weight: 83.8 kg 82.6 kg    Examination: General: Elderly, does not look acutely ill HENT: Moist oral mucosa Lungs: Decreased air movement bilaterally very faint rales at bases Cardiovascular: S1-S2 appreciated Abdomen: Bowel sounds appreciated Extremities: No clubbing, no edema  Neuro: Alert and oriented x3, moving all extremities GU:   Resolved Hospital Problem list     Assessment & Plan:  COVID-pneumonia  Hypoxemic respiratory failure -Continue oxygen supplementation  Abnormal CT scan with  multifocal infiltrate -finding on CT likely related to COVID infection  Appropriate to cover patient with empiric antibiotics for possible pneumonia  Continue steroids -Patient did complete Paxlovid as outpatient for 5 days  Suggest to continue with steroids, 10-day course of steroids appropriate Continue to monitor inflammatory markers  Other medical problems include diabetes Hypertension Chronic kidney disease stage II  Wean oxygen supplementation as tolerated Best Practice (right click and "Reselect all SmartList Selections" daily)   Diet/type: Regular consistency (see orders) DVT prophylaxis: SCD GI prophylaxis: N/A Lines: N/A Foley:  N/A Code Status:  full code Last date of multidisciplinary goals of care discussion [per primary]  Labs   CBC: Recent Labs  Lab 01/09/22 1105 01/10/22 0026  WBC 12.0* 11.8*  HGB 12.9* 12.4*  HCT 37.2* 37.6*  MCV 85.7 89.7  PLT 264 825    Basic Metabolic Panel: Recent Labs  Lab 01/09/22 1105 01/10/22 0026  NA 138 141  K 4.0 4.0  CL 104 110  CO2 22 24  GLUCOSE 133* 68*  BUN 21 20  CREATININE 1.44* 1.38*  CALCIUM 9.8 8.8*   GFR: Estimated Creatinine Clearance: 55.5 mL/min (A) (by C-G formula based on SCr of 1.38 mg/dL (H)). Recent Labs  Lab 01/09/22 1105 01/10/22 0026  PROCALCITON  --  0.43  WBC 12.0* 11.8*    Liver Function Tests: Recent Labs  Lab 01/10/22 0026  AST 29  ALT 31  ALKPHOS 86  BILITOT 0.8  PROT 7.3  ALBUMIN 2.7*   No results for input(s): "LIPASE", "AMYLASE" in the last 168 hours. No results for input(s): "AMMONIA" in the last 168 hours.  ABG    Component Value Date/Time   TCO2 24 08/18/2015 0912     Coagulation Profile: No results for input(s): "INR", "PROTIME" in the last 168 hours.  Cardiac Enzymes: No results for input(s): "CKTOTAL", "CKMB", "CKMBINDEX", "TROPONINI" in the last 168 hours.  HbA1C: Hgb A1c MFr Bld  Date/Time Value Ref Range Status  02/17/2017 10:42 AM 9.5 (H)  4.8 - 5.6 % Final    Comment:    (NOTE) Pre diabetes:          5.7%-6.4% Diabetes:              >6.4% Glycemic control for   <7.0% adults with diabetes     CBG: Recent Labs  Lab 01/09/22 1114  GLUCAP 127*    Review of Systems:   Shortness of breath  Past Medical History:  He,  has a past medical history of Arthritis, Diabetes mellitus without complication ([place]), Elevated PSA, Hypertension, Phimosis, Prostate cancer ([place]), Sleep apnea (2004), and Wears glasses.   Surgical History:   Past Surgical History:  Procedure Laterality Date   CIRCUMCISION N/A 08/18/2015   Procedure: CIRCUMCISION WITH BIOPSY OF PENILE NEVUS ;  Surgeon: Irine Seal, MD;  Location: [facility];  Service: Urology;  Laterality: N/A;   CYST EXCISION Right    wrist   LUMBAR LAMINECTOMY/DECOMPRESSION MICRODISCECTOMY N/A 02/22/2017   Procedure: Decompression L3-L4, L-4-L5;  Surgeon: Latanya Maudlin, MD;  Location: [facility];  Service: Orthopedics;  Laterality: N/A;   PROSTATE BIOPSY N/A 08/18/2015   Procedure: PROSTATE ULTRASOUND AND BIOPSY;  Surgeon: Irine Seal, MD;  Location: [facility];  Service: Urology;  Laterality: N/A;     Social History:   reports that he quit smoking about 7 years ago. His smoking use included cigarettes. He has a 22.00 pack-year smoking history. He has never used smokeless tobacco. He reports that he does not currently use alcohol. He reports that he does not use drugs.   Family History:  His family history is negative for Cancer, Breast cancer, Prostate cancer, Pancreatic cancer, and Colon cancer.   Allergies Allergies  Allergen Reactions   Semaglutide      Home Medications  Prior to Admission medications   Medication Sig Start Date End Date Taking? Authorizing Provider  amLODipine (NORVASC) 5 MG tablet Take 5 mg by mouth daily. 11/29/21  Yes [provider]  aspirin EC 81 MG tablet Take 81 mg by mouth daily.   Yes [provider]   Cholecalciferol (D 5000) 5000 units capsule Take 5,000 Units by mouth daily.   Yes [provider]  glucose blood test strip 1 each by Other route daily.   Yes [provider]  Insulin Glargine (LANTUS SOLOSTAR) 100 UNIT/ML Solostar Pen Inject 32 Units into the skin daily.   Yes [provider]  Insulin Pen Needle 31G X 5 MM MISC Inject 1 each into the skin daily.   Yes [provider]  JARDIANCE 25 MG TABS tablet Take 25 mg by mouth daily. 11/27/21  Yes [provider]  LAGEVRIO 200 MG CAPS capsule Take 1 capsule by mouth 2 (two) times daily. 01/04/22  Yes [provider]  metFORMIN (GLUCOPHAGE) 500 MG tablet Take 1,000 mg by mouth daily with breakfast.   Yes [provider]  olmesartan (BENICAR) 40 MG tablet Take 40 mg  by mouth daily.   Yes [provider]  VICTOZA 18 MG/3ML SOPN Inject 1.2 mg into the skin daily. 12/24/18  Yes [provider]  alfuzosin (UROXATRAL) 10 MG 24 hr tablet Take 1 tablet (10 mg total) by mouth at bedtime. Patient not taking: Reported on 01/09/2022 03/08/19   Tyler Pita, MD

## 2022-01-11 DIAGNOSIS — J9601 Acute respiratory failure with hypoxia: Secondary | ICD-10-CM | POA: Diagnosis not present

## 2022-01-11 LAB — COMPREHENSIVE METABOLIC PANEL
ALT: 25 U/L (ref 0–44)
AST: 17 U/L (ref 15–41)
Albumin: 2.4 g/dL — ABNORMAL LOW (ref 3.5–5.0)
Alkaline Phosphatase: 86 U/L (ref 38–126)
Anion gap: 8 (ref 5–15)
BUN: 30 mg/dL — ABNORMAL HIGH (ref 8–23)
CO2: 22 mmol/L (ref 22–32)
Calcium: 9.2 mg/dL (ref 8.9–10.3)
Chloride: 108 mmol/L (ref 98–111)
Creatinine, Ser: 1.41 mg/dL — ABNORMAL HIGH (ref 0.61–1.24)
GFR, Estimated: 54 mL/min — ABNORMAL LOW (ref >60)
Glucose, Bld: 288 mg/dL — ABNORMAL HIGH (ref 70–99)
Potassium: 4.9 mmol/L (ref 3.5–5.1)
Sodium: 138 mmol/L (ref 135–145)
Total Bilirubin: 0.4 mg/dL (ref 0.3–1.2)
Total Protein: 7.1 g/dL (ref 6.5–8.1)

## 2022-01-11 LAB — CBC
HCT: 36.7 % — ABNORMAL LOW (ref 39.0–52.0)
Hemoglobin: 12.4 g/dL — ABNORMAL LOW (ref 13.0–17.0)
MCH: 30 pg (ref 26.0–34.0)
MCHC: 33.8 g/dL (ref 30.0–36.0)
MCV: 88.6 fL (ref 80.0–100.0)
Platelets: 311 10*3/uL (ref 150–400)
RBC: 4.14 MIL/uL — ABNORMAL LOW (ref 4.22–5.81)
RDW: 12.1 % (ref 11.5–15.5)
WBC: 7.1 10*3/uL (ref 4.0–10.5)
nRBC: 0 % (ref 0.0–0.2)

## 2022-01-11 LAB — C-REACTIVE PROTEIN: CRP: 26.2 mg/dL — ABNORMAL HIGH (ref <1.0)

## 2022-01-11 LAB — GLUCOSE, CAPILLARY
Glucose-Capillary: 190 mg/dL — ABNORMAL HIGH (ref 70–99)
Glucose-Capillary: 272 mg/dL — ABNORMAL HIGH (ref 70–99)
Glucose-Capillary: 205 mg/dL — ABNORMAL HIGH (ref 70–99)
Glucose-Capillary: 295 mg/dL — ABNORMAL HIGH (ref 70–99)

## 2022-01-11 LAB — D-DIMER, QUANTITATIVE: D-Dimer, Quant: 1.36 ug/mL-FEU — ABNORMAL HIGH (ref 0.00–0.50)

## 2022-01-11 NOTE — Evaluation (Signed)
Occupational Therapy Evaluation Patient Details Name: Phillip Holloway MRN: [id number] DOB: 25-Mar-1951 Today's Date: 01/11/2022   History of Present Illness Patient is a 71 year old male who was admitted with acute hypoxic respiratory failure, COPD pneumonia, bilateral multifocal pneumonia.PMH: prostate cancer, diabetes type 2, hypertension, recent COVID infection   Clinical Impression   Patient evaluated by Occupational Therapy with no further acute OT needs identified. All education has been completed and the patient has no further questions. Patient was educated on ECT with importance of transitioning back to typically routine at a rate comfortable with body. Patient verbalized understanding. Patient endorsed being at baseline for ADLs at this time.  See below for any follow-up Occupational Therapy or equipment needs. OT is signing off. Thank you for this referral.       Recommendations for follow up therapy are one component of a multi-disciplinary discharge planning process, led by the attending physician.  Recommendations may be updated based on patient status, additional functional criteria and insurance authorization.   Follow Up Recommendations  No OT follow up    Assistance Recommended at Discharge PRN  Patient can return home with the following Assistance with cooking/housework    Functional Status Assessment  Patient has not had a recent decline in their functional status  Equipment Recommendations  None recommended by OT    Recommendations for Other Services       Precautions / Restrictions Restrictions Weight Bearing Restrictions: No      Mobility Bed Mobility Overal bed mobility: Modified Independent                  Transfers Overall transfer level: Modified independent                        Balance Overall balance assessment: Modified Independent                                         ADL either performed or  assessed with clinical judgement   ADL Overall ADL's : Modified independent                                       General ADL Comments: patient is able to complete bed mobility, LB dressing, toileting tasks with MI on this date. patietn demonstrated good safety awareness managing lines to get in and out of bed on this date with MI. patient endorsed being able to complete ADLs at this time. patient was extensivelty educated on breathign strategies (avoiding humid enviornments and hot showers). patient verbalized understanding. patietn was educated on ECT and importance fo transitionign back into routines at a medium speed rather than full speed after hospital d/c. patient verbalized understanding.     Vision Baseline Vision/History: 1 Wears glasses Patient Visual Report: No change from baseline       Perception     Praxis      Pertinent Vitals/Pain Pain Assessment Pain Assessment: No/denies pain     Hand Dominance Right   Extremity/Trunk Assessment Upper Extremity Assessment Upper Extremity Assessment: Overall WFL for tasks assessed   Lower Extremity Assessment Lower Extremity Assessment: Defer to PT evaluation   Cervical / Trunk Assessment Cervical / Trunk Assessment: Normal   Communication Communication Communication: No difficulties   Cognition Arousal/Alertness: Awake/alert Behavior During  Therapy: WFL for tasks assessed/performed Overall Cognitive Status: Within Functional Limits for tasks assessed                                       General Comments       Exercises     Shoulder Instructions      Home Living Family/patient expects to be discharged to:: Private residence Living Arrangements: Spouse/significant other Available Help at Discharge: Family Type of Home: House Home Access: Stairs to enter Technical brewer of Steps: 4 Entrance Stairs-Rails: Left Home Layout: Two level Alternate Level Stairs-Number of  Steps: 14 has a chair lift from mother Alternate Level Stairs-Rails: Right Bathroom Shower/Tub: Walk-in shower         Home Equipment: None          Prior Functioning/Environment Prior Level of Function : Independent/Modified Independent               ADLs Comments: 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        OT Problem List:        OT Treatment/Interventions:      OT Goals(Current goals can be found in the care plan section) Acute Rehab OT Goals OT Goal Formulation: All assessment and education complete, DC therapy  OT Frequency:      Co-evaluation              AM-PAC OT "6 Clicks" Daily Activity     Outcome Measure Help from another person eating meals?: None Help from another person taking care of personal grooming?: None Help from another person toileting, which includes using toliet, bedpan, or urinal?: None Help from another person bathing (including washing, rinsing, drying)?: None Help from another person to put on and taking off regular upper body clothing?: None Help from another person to put on and taking off regular lower body clothing?: None 6 Click Score: 24   End of Session Nurse Communication: Mobility status  Activity Tolerance: Patient tolerated treatment well Patient left: in bed;with call bell/phone within reach  OT Visit Diagnosis: Unsteadiness on feet (R26.81)                Time: 7035-0093 OT Time Calculation (min): 13 min Charges:  OT General Charges $OT Visit: 1 Visit OT Evaluation $OT Eval Low Complexity: 1 Low  Denzil Bristol OTR/L, MS Acute Rehabilitation Department Office# [phone]   Marcellina Millin 01/11/2022, 3:40 PM

## 2022-01-11 NOTE — Progress Notes (Signed)
PROGRESS NOTE  Phillip Holloway  GHW:[id number] DOB: 1950/09/19 DOA: 01/09/2022 PCP: Lujean Amel, MD   Brief Narrative:  Patient is a 71 year old male with history of prostate cancer, diabetes type 2, hypertension, recent COVID infection who presented here from home with complaints of shortness of breath, cough for about 1 and half weeks.  He was diagnosed with COVID about 6 days ago and completed course of antivirals but not sure what he was taking.  On presentation ,he was hemodynamically stable but requiring 4 L of oxygen to maintain saturation.  Lab work showed creatinine of 1.4, WBC of 12.  X-ray /CT of the chest showed extensive multifocal bilateral pneumonia suggesting bacterial versus viral pneumonia with superimposed pulmonary edema.  Patient was admitted for the management of acute hypoxic respiratory failure secondary to multifocal pneumonia.  Started on antibiotics.  COVID test positive, started on steroids due to hypoxia.  PCCM also consulted due to finding of extensive bilateral interstitial changes. Respiratory status slowly improving.  Due to high level of CRP, plan is to continue IV steroids and gradual weaning of oxygen.  Assessment & Plan:  Principal Problem:   Acute respiratory failure with hypoxia (HCC) Active Problems:   CAP (community acquired pneumonia)   COVID-19 virus infection   HTN (hypertension)   DM (diabetes mellitus) ([place])  Acute hypoxic respiratory failure: Not on oxygen at home.  No history of previous lung disease.  Presented with shortness of breath, cough.  Required 4 L of oxygen on presentation to maintain saturation. Chest imaging showed action of bilateral multifocal infiltrates suggesting viral versus bacterial pneumonia. Empirically  on on antibiotics to cover for community acquired pneumonia. Patient was diagnosed with COVID about 6 days ago. Repeat test came out to be positive.  Patient states he took oral medication for 5 days as an outpatient,  most likely Paxlovid. Continue airborne precaution. Feels much better today with no complaint of worsening shortness of breath or cough.  Currently on 2 L of oxygen.  We will gradually wean  COVID-pneumonia/bilateral multifocal pneumonia: Could be viral but antibiotics started to cover for bacterial pneumonia.  Procalcitonin nonreassuring.  We will probably continue antibiotics for total 5 days course.  We will add on IV steroids because of hypoxia On reviewing the CT chest, he was found to have extensive bilateral infiltrates/interstitial pattern, PCCM also consulted  Continue to monitor inflammatory markers.  CRP very high.  We will continue IV steroids for today, we will change the steroids to oral with 10 days total course when appropriate  Diabetes type 2: On insulin at home.  Continue current insulin regimen.  Monitor blood sugars  Hypertension: Continue amlodipine, irbesartan.  Monitor blood pressure  CKD stage II: Baseline creatinine ranged from 1.3-1.4.  Currently kidney function at baseline.  Weakness: Most likely secondary to COVID illness.   Lives with wife.  No problem with ambulation when healthy.  Requested PT/OT evaluation         DVT prophylaxis:enoxaparin (LOVENOX) injection 40 mg Start: 01/09/22 2200     Code Status: Full Code  Family Communication: Wife at bedside  Patient status:Inpatient  Patient is from :Home  Anticipated discharge VE:LFYB  Estimated DC date:2-3 days   Consultants: PCCM  Procedures:None  Antimicrobials:  Anti-infectives (From admission, onward)    Start     Dose/Rate Route Frequency Ordered Stop   01/11/22 1000  azithromycin (ZITHROMAX) tablet 500 mg        500 mg Oral Daily 01/10/22 1210 01/13/22 0959  01/10/22 1000  cefTRIAXone (ROCEPHIN) 2 g in sodium chloride 0.9 % 100 mL IVPB        2 g 200 mL/hr over 30 Minutes Intravenous Every 24 hours 01/09/22 1834 01/15/22 0959   01/10/22 1000  azithromycin (ZITHROMAX) 500 mg in  sodium chloride 0.9 % 250 mL IVPB  Status:  Discontinued        500 mg 250 mL/hr over 60 Minutes Intravenous Every 24 hours 01/09/22 1834 01/10/22 1210   01/09/22 1815  azithromycin (ZITHROMAX) 500 mg in sodium chloride 0.9 % 250 mL IVPB        500 mg 250 mL/hr over 60 Minutes Intravenous  Once 01/09/22 1722 01/09/22 2113   01/09/22 1245  cefTRIAXone (ROCEPHIN) 1 g in sodium chloride 0.9 % 100 mL IVPB        1 g 200 mL/hr over 30 Minutes Intravenous  Once 01/09/22 1244 01/09/22 1649   01/09/22 1245  azithromycin (ZITHROMAX) 500 mg in sodium chloride 0.9 % 250 mL IVPB  Status:  Discontinued        500 mg 250 mL/hr over 60 Minutes Intravenous  Once 01/09/22 1244 01/09/22 2113       Subjective: Patient seen and examined at the bedside this morning.  Hemodynamically stable.  On 2 L of oxygen.  Feels better.  Wife at bedside.  Denies any worsening shortness of breath or cough.  Complains of weakness though   Objective: Vitals:   01/10/22 0902 01/10/22 1356 01/10/22 2318 01/11/22 0701  BP: 139/75 (!) 145/78 127/74 (!) 141/66  Pulse: 91 88 79 67  Resp: (!) '22 18 20 18  '$ Temp: 98.5 F (36.9 C) 99 F (37.2 C)  98.1 F (36.7 C)  TempSrc: Oral   Oral  SpO2: 96% 97% 96% 98%  Weight:      Height:        Intake/Output Summary (Last 24 hours) at 01/11/2022 1138 Last data filed at 01/11/2022 0200 Gross per 24 hour  Intake 953 ml  Output --  Net 953 ml   Filed Weights   01/09/22 1116 01/10/22 0423  Weight: 83.8 kg 82.6 kg    Examination:  General exam: Overall comfortable, not in distress HEENT: PERRL Respiratory system: Few scattered crackles but definitely better than yesterday Cardiovascular system: S1 & S2 heard, RRR.  Gastrointestinal system: Abdomen is nondistended, soft and nontender. Central nervous system: Alert and oriented Extremities: No edema, no clubbing ,no cyanosis Skin: No rashes, no ulcers,no icterus     Data Reviewed: I have personally reviewed following  labs and imaging studies  CBC: Recent Labs  Lab 01/09/22 1105 01/10/22 0026 01/11/22 0353  WBC 12.0* 11.8* 7.1  HGB 12.9* 12.4* 12.4*  HCT 37.2* 37.6* 36.7*  MCV 85.7 89.7 88.6  PLT 264 266 151   Basic Metabolic Panel: Recent Labs  Lab 01/09/22 1105 01/10/22 0026 01/11/22 0353  NA 138 141 138  K 4.0 4.0 4.9  CL 104 110 108  CO2 '22 24 22  '$ GLUCOSE 133* 68* 288*  BUN 21 20 30*  CREATININE 1.44* 1.38* 1.41*  CALCIUM 9.8 8.8* 9.2     Recent Results (from the past 240 hour(s))  SARS Coronavirus 2 by RT PCR (hospital order, performed in [facility] hospital lab) *cepheid single result test* Anterior Nasal Swab     Status: Abnormal   Collection Time: 01/10/22  9:19 AM   Specimen: Anterior Nasal Swab  Result Value Ref Range Status   SARS Coronavirus 2 by RT  PCR POSITIVE (A) NEGATIVE Final    Comment: (NOTE) SARS-CoV-2 target nucleic acids are DETECTED  SARS-CoV-2 RNA is generally detectable in upper respiratory specimens  during the acute phase of infection.  Positive results are indicative  of the presence of the identified virus, but do not rule out bacterial infection or co-infection with other pathogens not detected by the test.  Clinical correlation with patient history and  other diagnostic information is necessary to determine patient infection status.  The expected result is negative.  Fact Sheet for Patients:   https://www.patel.info/   Fact Sheet for Healthcare Providers:   https://hall.com/    This test is not yet approved or cleared by the [place] FDA and  has been authorized for detection and/or diagnosis of SARS-CoV-2 by FDA under an Emergency Use Authorization (EUA).  This EUA will remain in effect (meaning this test can be used) for the duration of  the COVID-19 declaration under Section 564(b)(1)  of the Act, 21 U.S.C. section 360-bbb-3(b)(1), unless the authorization is terminated or revoked  sooner.   Performed at [facility], [place] [address], [place], [place] [address]      Radiology Studies: CT Angio Chest PE W and/or Wo Contrast  Result Date: 01/09/2022 CLINICAL DATA:  Pulmonary embolus suspected EXAM: CT ANGIOGRAPHY CHEST WITH CONTRAST TECHNIQUE: Multidetector CT imaging of the chest was performed using the standard protocol during bolus administration of intravenous contrast. Multiplanar CT image reconstructions and MIPs were obtained to evaluate the vascular anatomy. RADIATION DOSE REDUCTION: This exam was performed according to the departmental dose-optimization program which includes automated exposure control, adjustment of the mA and/or kV according to patient size and/or use of iterative reconstruction technique. CONTRAST:  38m OMNIPAQUE IOHEXOL 350 MG/ML SOLN COMPARISON:  None Available. FINDINGS: Cardiovascular: No evidence of pulmonary embolus. Normal heart size. No pericardial effusion. Normal caliber thoracic aorta with no significant atherosclerotic disease. No coronary artery calcifications. Mediastinum/Nodes: Esophagus and thyroid are unremarkable. Mildly enlarged mediastinal and bilateral hilar lymph nodes, likely reactive, reference right hilar lymph node measuring 1.3 cm in short axis on series 4, image 69. Lungs/Pleura: Central airways are patent. Diffuse patchy ground-glass opacities with associated smooth septal thickening. Trace bilateral pleural effusions. Upper Abdomen: Gallstones.  No acute abnormality. Musculoskeletal: No chest wall abnormality. No acute or significant osseous findings. Review of the MIP images confirms the above findings. IMPRESSION: 1. No evidence of pulmonary embolus. 2. Diffuse patchy ground-glass opacities, findings are nonspecific with differential considerations including pulmonary edema or infectious/inflammatory etiology, including COVID-19. 3. Trace bilateral pleural effusions. Electronically Signed   By: LYetta GlassmanM.D.   On: 01/09/2022 15:00    Scheduled Meds:  amLODipine  5 mg Oral Daily   azithromycin  500 mg Oral Daily   dexamethasone  6 mg Oral Daily   enoxaparin (LOVENOX) injection  40 mg Subcutaneous Q24H   insulin aspart  0-15 Units Subcutaneous TID WC   insulin glargine-yfgn  15 Units Subcutaneous Daily   irbesartan  300 mg Oral Daily   sodium chloride flush  3 mL Intravenous Q12H   Continuous Infusions:  sodium chloride 10 mL/hr at 01/11/22 0950   cefTRIAXone (ROCEPHIN)  IV 2 g (01/11/22 0950)     LOS: 2 days   AShelly Coss MD [facility] P8/29/2023, 11:38 AM

## 2022-01-12 DIAGNOSIS — J189 Pneumonia, unspecified organism: Secondary | ICD-10-CM | POA: Diagnosis not present

## 2022-01-12 DIAGNOSIS — U071 COVID-19: Secondary | ICD-10-CM | POA: Diagnosis not present

## 2022-01-12 DIAGNOSIS — J9601 Acute respiratory failure with hypoxia: Secondary | ICD-10-CM | POA: Diagnosis not present

## 2022-01-12 LAB — COMPREHENSIVE METABOLIC PANEL
ALT: 21 U/L (ref 0–44)
AST: 13 U/L — ABNORMAL LOW (ref 15–41)
Albumin: 2.5 g/dL — ABNORMAL LOW (ref 3.5–5.0)
Alkaline Phosphatase: 79 U/L (ref 38–126)
Anion gap: 8 (ref 5–15)
BUN: 34 mg/dL — ABNORMAL HIGH (ref 8–23)
CO2: 22 mmol/L (ref 22–32)
Calcium: 9.1 mg/dL (ref 8.9–10.3)
Chloride: 108 mmol/L (ref 98–111)
Creatinine, Ser: 1.38 mg/dL — ABNORMAL HIGH (ref 0.61–1.24)
GFR, Estimated: 55 mL/min — ABNORMAL LOW (ref >60)
Glucose, Bld: 205 mg/dL — ABNORMAL HIGH (ref 70–99)
Potassium: 4.7 mmol/L (ref 3.5–5.1)
Sodium: 138 mmol/L (ref 135–145)
Total Bilirubin: 0.4 mg/dL (ref 0.3–1.2)
Total Protein: 7 g/dL (ref 6.5–8.1)

## 2022-01-12 LAB — CBC
HCT: 37.6 % — ABNORMAL LOW (ref 39.0–52.0)
Hemoglobin: 12.5 g/dL — ABNORMAL LOW (ref 13.0–17.0)
MCH: 29.5 pg (ref 26.0–34.0)
MCHC: 33.2 g/dL (ref 30.0–36.0)
MCV: 88.7 fL (ref 80.0–100.0)
Platelets: 361 10*3/uL (ref 150–400)
RBC: 4.24 MIL/uL (ref 4.22–5.81)
RDW: 12.1 % (ref 11.5–15.5)
WBC: 15.7 10*3/uL — ABNORMAL HIGH (ref 4.0–10.5)
nRBC: 0 % (ref 0.0–0.2)

## 2022-01-12 LAB — GLUCOSE, CAPILLARY
Glucose-Capillary: 148 mg/dL — ABNORMAL HIGH (ref 70–99)
Glucose-Capillary: 234 mg/dL — ABNORMAL HIGH (ref 70–99)
Glucose-Capillary: 218 mg/dL — ABNORMAL HIGH (ref 70–99)
Glucose-Capillary: 345 mg/dL — ABNORMAL HIGH (ref 70–99)

## 2022-01-12 LAB — D-DIMER, QUANTITATIVE: D-Dimer, Quant: 1.11 ug/mL-FEU — ABNORMAL HIGH (ref 0.00–0.50)

## 2022-01-12 LAB — C-REACTIVE PROTEIN: CRP: 13.3 mg/dL — ABNORMAL HIGH (ref <1.0)

## 2022-01-12 NOTE — Evaluation (Addendum)
Physical Therapy Evaluation Patient Details Name: Phillip Holloway MRN: [id number] DOB: [birth year] Today's Date: 01/12/2022  History of Present Illness  Patient is a 71 year old male who was admitted with acute hypoxic respiratory failure, COPD pneumonia, bilateral multifocal pneumonia.PMH: prostate cancer, diabetes type 2, hypertension, recent COVID infection  Clinical Impression  Patient evaluated by Physical Therapy with no further acute PT needs identified. All education has been completed and the patient has no further questions.  See below for any follow-up Physical Therapy or equipment needs. PT is signing off. Thank you for this referral.  Amb O2 sats checked this session Pt 90% at rest on RA 89-92% amb on RA 93% at rest after amb SpO2 monitored on dinamap, continuous pulse oximeter in room with frequent poor wave pattern and inaccurate reading (consistently lower reading on continuous--may need new finger probe)  Encouraged use of IS Pt left on .5L         Recommendations for follow up therapy are one component of a multi-disciplinary discharge planning process, led by the attending physician.  Recommendations may be updated based on patient status, additional functional criteria and insurance authorization.  Follow Up Recommendations No PT follow up      Assistance Recommended at Discharge PRN  Patient can return home with the following  Help with stairs or ramp for entrance    Equipment Recommendations None recommended by PT  Recommendations for Other Services       Functional Status Assessment Patient has not had a recent decline in their functional status     Precautions / Restrictions Precautions Precaution Comments: monitor O2 Restrictions Weight Bearing Restrictions: No      Mobility  Bed Mobility Overal bed mobility: Modified Independent                  Transfers Overall transfer level: Modified independent                       Ambulation/Gait Ambulation/Gait assistance: Supervision Gait Distance (Feet): 180 Feet Assistive device: None Gait Pattern/deviations: Drifts right/left       General Gait Details: slight drifting, no overt LOB superivison for safety and to monitor O2; SpO2=89% -92% on RA  Stairs            Wheelchair Mobility    Modified Rankin (Stroke Patients Only)       Balance Overall balance assessment: Modified Independent                                           Pertinent Vitals/Pain Pain Assessment Pain Assessment: No/denies pain    Home Living Family/patient expects to be discharged to:: Private residence Living Arrangements: Spouse/significant other Available Help at Discharge: Family Type of Home: House Home Access: Stairs to enter Entrance Stairs-Rails: Left Entrance Stairs-Number of Steps: 4 Alternate Level Stairs-Number of Steps: 14 has a chair lift from mother Home Layout: Two level Home Equipment: None      Prior Function Prior Level of Function : Independent/Modified Independent               ADLs Comments: 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     Hand Dominance        Extremity/Trunk Assessment   Upper Extremity Assessment Upper Extremity Assessment: Defer to OT evaluation;Overall [facility] for tasks assessed    Lower Extremity  Assessment Lower Extremity Assessment: Overall WFL for tasks assessed       Communication   Communication: No difficulties  Cognition Arousal/Alertness: Awake/alert Behavior During Therapy: WFL for tasks assessed/performed Overall Cognitive Status: Within Functional Limits for tasks assessed                                          General Comments      Exercises     Assessment/Plan    PT Assessment Patient does not need any further PT services  PT Problem List         PT Treatment Interventions      PT Goals (Current goals can be found in the Care  Plan section)  Acute Rehab PT Goals Patient Stated Goal: feel better and go home PT Goal Formulation: All assessment and education complete, DC therapy    Frequency       Co-evaluation               AM-PAC PT "6 Clicks" Mobility  Outcome Measure Help needed turning from your back to your side while in a flat bed without using bedrails?: None Help needed moving from lying on your back to sitting on the side of a flat bed without using bedrails?: None Help needed moving to and from a bed to a chair (including a wheelchair)?: None Help needed standing up from a chair using your arms (e.g., wheelchair or bedside chair)?: None Help needed to walk in hospital room?: None Help needed climbing 3-5 steps with a railing? : None 6 Click Score: 24    End of Session Equipment Utilized During Treatment: Gait belt Activity Tolerance: Patient tolerated treatment well Patient left: in bed;with call bell/phone within reach   PT Visit Diagnosis: Other abnormalities of gait and mobility (R26.89)    Time: 4665-9935 PT Time Calculation (min) (ACUTE ONLY): 17 min   Charges:   PT Evaluation $PT Eval Low Complexity: Fernando Salinas, PT  Acute Rehab Dept [facility]) [phone]  [facility] Weekend Pager (Saturday/Sunday only)  [phone]  01/12/2022   [facility] 01/12/2022, 5:04 PM

## 2022-01-12 NOTE — Hospital Course (Signed)
Phillip Holloway is a 71 yo male with PMH prostate cancer, DM II, HTN who presented with worsening shortness of breath, cough.  He was recently diagnosed outpatient with COVID and completed outpatient antiviral course.  Due to worsening shortness of breath, he presented for further evaluation. He was found to be hypoxic requiring 4 L oxygen initially.  Imaging was concerning for underlying superimposed bacterial pneumonia and he was started on antibiotics on admission as well.

## 2022-01-12 NOTE — Progress Notes (Signed)
Progress Note    Phillip Holloway   YDX:[id number]  DOB: 1950-07-22  DOA: 01/09/2022     3 PCP: Lujean Amel, MD  Initial CC: Shortness of breath  Hospital Course: Phillip Holloway is a 71 yo male with PMH prostate cancer, DM II, HTN who presented with worsening shortness of breath, cough.  He was recently diagnosed outpatient with COVID and completed outpatient antiviral course.  Due to worsening shortness of breath, he presented for further evaluation. He was found to be hypoxic requiring 4 L oxygen initially.  Imaging was concerning for underlying superimposed bacterial pneumonia and he was started on antibiotics on admission as well.  Interval History:  No events overnight.  Resting in bed comfortable when seen.  Oxygen has been turned down to 1 L.  He has not used incentive spirometer any.  This was given to him bedside and he was encouraged to continue use throughout the day in efforts to help wean oxygen further.  Assessment and Plan:  Acute hypoxic respiratory failure: Not on oxygen at home.  No history of previous lung disease.  Presented with shortness of breath, cough.  Required 4 L of oxygen on presentation to maintain saturation. Chest imaging showed action of bilateral multifocal infiltrates suggesting viral versus bacterial pneumonia. Empirically  on on antibiotics to cover for community acquired pneumonia. Patient was diagnosed with COVID about 6 days PTA. Repeat test came out to be positive.  Patient states he took oral medication for 5 days as an outpatient Continue airborne precaution. -Continue weaning oxygen - Continue encouraging incentive spirometer use   COVID-pneumonia/bilateral multifocal pneumonia: Could be viral but antibiotics started to cover for bacterial pneumonia.  Procalcitonin elevated - continue abx - continue steroids in setting of hypoxia on admission; complete 10 days   Diabetes type 2: On insulin at home.  Continue current insulin regimen.   Monitor blood sugars   Hypertension: Continue amlodipine, irbesartan.  Monitor blood pressure   CKD stage II: Baseline creatinine ranged from 1.3-1.4.  Currently kidney function at baseline.   Weakness: Most likely secondary to COVID illness.   Lives with wife.  No problem with ambulation when healthy.  Requested PT/OT evaluation   Old records reviewed in assessment of this patient  Antimicrobials: Azithromycin 01/09/2022 >> current Rocephin 01/09/2022 >> current  DVT prophylaxis:  enoxaparin (LOVENOX) injection 40 mg Start: 01/09/22 2200   Code Status:   Code Status: Full Code  Mobility Assessment (last 72 hours)     Mobility Assessment     Row Name 01/12/22 0835 01/11/22 1539 01/11/22 0950 01/10/22 0900 01/09/22 2028   Does patient have an order for bedrest or is patient medically unstable No - Continue assessment -- No - Continue assessment No - Continue assessment No - Continue assessment   What is the highest level of mobility based on the progressive mobility assessment? Level 6 (Walks independently in room and hall) - Balance while walking in room without assist - Complete Level 6 (Walks independently in room and hall) - Balance while walking in room without assist - Complete Level 6 (Walks independently in room and hall) - Balance while walking in room without assist - Complete Level 6 (Walks independently in room and hall) - Balance while walking in room without assist - Complete Level 5 (Walks with assist in room/hall) - Balance while stepping forward/back and can walk in room with assist - Complete            Barriers to discharge:  Disposition  Plan:  Home Status is: Inpt  Objective: Blood pressure 126/72, pulse 66, temperature 97.7 F (36.5 C), temperature source Oral, resp. rate 18, height 6' (1.829 m), weight 82.6 kg, SpO2 95 %.  Examination:  Physical Exam Constitutional:      General: He is not in acute distress.    Appearance: Normal appearance.  HENT:      Head: Normocephalic and atraumatic.     Mouth/Throat:     Mouth: Mucous membranes are moist.  Eyes:     Extraocular Movements: Extraocular movements intact.  Cardiovascular:     Rate and Rhythm: Normal rate and regular rhythm.     Heart sounds: Normal heart sounds.  Pulmonary:     Effort: Pulmonary effort is normal. No respiratory distress.     Breath sounds: Rales present. No wheezing.  Abdominal:     General: Bowel sounds are normal. There is no distension.     Palpations: Abdomen is soft.     Tenderness: There is no abdominal tenderness.  Musculoskeletal:        General: Normal range of motion.     Cervical back: Normal range of motion and neck supple.  Skin:    General: Skin is warm and dry.  Neurological:     General: No focal deficit present.     Mental Status: He is alert.  Psychiatric:        Mood and Affect: Mood normal.        Behavior: Behavior normal.      Consultants:  Pulmonology   Procedures:    Data Reviewed: Results for orders placed or performed during the hospital encounter of 01/09/22 (from the past 24 hour(s))  Glucose, capillary     Status: Abnormal   Collection Time: 01/11/22  5:33 PM  Result Value Ref Range   Glucose-Capillary 205 (H) 70 - 99 mg/dL  Glucose, capillary     Status: Abnormal   Collection Time: 01/11/22  9:54 PM  Result Value Ref Range   Glucose-Capillary 295 (H) 70 - 99 mg/dL  CBC     Status: Abnormal   Collection Time: 01/12/22  3:51 AM  Result Value Ref Range   WBC 15.7 (H) 4.0 - 10.5 K/uL   RBC 4.24 4.22 - 5.81 MIL/uL   Hemoglobin 12.5 (L) 13.0 - 17.0 g/dL   HCT 37.6 (L) 39.0 - 52.0 %   MCV 88.7 80.0 - 100.0 fL   MCH 29.5 26.0 - 34.0 pg   MCHC 33.2 30.0 - 36.0 g/dL   RDW 12.1 11.5 - 15.5 %   Platelets 361 150 - 400 K/uL   nRBC 0.0 0.0 - 0.2 %  Comprehensive metabolic panel     Status: Abnormal   Collection Time: 01/12/22  3:51 AM  Result Value Ref Range   Sodium 138 135 - 145 mmol/L   Potassium 4.7 3.5 - 5.1  mmol/L   Chloride 108 98 - 111 mmol/L   CO2 22 22 - 32 mmol/L   Glucose, Bld 205 (H) 70 - 99 mg/dL   BUN 34 (H) 8 - 23 mg/dL   Creatinine, Ser 1.38 (H) 0.61 - 1.24 mg/dL   Calcium 9.1 8.9 - 10.3 mg/dL   Total Protein 7.0 6.5 - 8.1 g/dL   Albumin 2.5 (L) 3.5 - 5.0 g/dL   AST 13 (L) 15 - 41 U/L   ALT 21 0 - 44 U/L   Alkaline Phosphatase 79 38 - 126 U/L   Total Bilirubin 0.4 0.3 - 1.2  mg/dL   GFR, Estimated 55 (L) >60 mL/min   Anion gap 8 5 - 15  C-reactive protein     Status: Abnormal   Collection Time: 01/12/22  3:51 AM  Result Value Ref Range   CRP 13.3 (H) <1.0 mg/dL  D-dimer, quantitative     Status: Abnormal   Collection Time: 01/12/22  3:51 AM  Result Value Ref Range   D-Dimer, Quant 1.11 (H) 0.00 - 0.50 ug/mL-FEU  Glucose, capillary     Status: Abnormal   Collection Time: 01/12/22  7:25 AM  Result Value Ref Range   Glucose-Capillary 148 (H) 70 - 99 mg/dL  Glucose, capillary     Status: Abnormal   Collection Time: 01/12/22 12:23 PM  Result Value Ref Range   Glucose-Capillary 234 (H) 70 - 99 mg/dL    I have Reviewed nursing notes, Vitals, and Lab results since pt's last encounter. Pertinent lab results : see above I have ordered test including BMP, CBC, Mg I have reviewed the last note from staff over past 24 hours I have discussed pt's care plan and test results with nursing staff, case manager   LOS: 3 days   Dwyane Dee, MD [facility] 01/12/2022, 2:23 PM

## 2022-01-12 NOTE — Plan of Care (Signed)
Plan of care reviewed and discussed with the patient. 

## 2022-01-12 NOTE — Progress Notes (Signed)
NAME:  Phillip Holloway, MRN:  [id number], DOB:  09/17/1950, LOS: 3 ADMISSION DATE:  01/09/2022, CONSULTATION DATE: 01/10/2022 REFERRING MD: Dr. Tawanna Solo CHIEF COMPLAINT: Shortness of breath, COVID-pneumonia  History of Present Illness:  Patient was recently diagnosed with COVID infection Came into the hospital worsening shortness of breath Has no underlying lung disease Reformed smoker quit about 4 years ago    Pertinent  Medical History   Past Medical History:  Diagnosis Date   Arthritis    Diabetes mellitus without complication ([place])    type 2   Elevated PSA    Hypertension    Phimosis    Prostate cancer ([place])    Sleep apnea 2004   pt states he has been dx'd w osa, but didn't follow- up  no mask pt. lost weight   Wears glasses      Significant Hospital Events: Including procedures, antibiotic start and stop dates in addition to other pertinent events   8/28 CT chest with multifocal infiltrates  Interim History / Subjective:  Breathing feels about the same Decreased oxygen requirement  Objective   Blood pressure 126/72, pulse 66, temperature 97.7 F (36.5 C), temperature source Oral, resp. rate 18, height 6' (1.829 m), weight 82.6 kg, SpO2 95 %.        Intake/Output Summary (Last 24 hours) at 01/12/2022 1005 Last data filed at 01/12/2022 0600 Gross per 24 hour  Intake 699.4 ml  Output --  Net 699.4 ml   Filed Weights   01/09/22 1116 01/10/22 0423  Weight: 83.8 kg 82.6 kg    Examination: General: Middle-age, does not look acutely ill HENT: External mucosa Lungs: Rales at the bases bilaterally Cardiovascular: S1-S2 appreciated Abdomen: Soft, bowel sounds appreciated Extremities: No clubbing, no peripheral edema Neuro: Alert oriented x3, nonfocal exam GU:   Resolved Hospital Problem list     Assessment & Plan:  .  COVID-pneumonia -Inflammation markers stable  .  Hypoxemic respiratory failure -Continue oxygen supplementation -Wean as  tolerated  .  Multifocal infiltrate on CT scan -Findings likely related to COVID infection  Possible community-acquired pneumonia -Complete course of empiric antibiotics  .  For his COVID-pneumonia Appropriate to complete 10 days of steroids  Diabetes Hypertension Chronic kidney disease  Appears to be stabilizing May need oxygen supplementation in the short-term  Will sign off, call as needed.  Best Practice (right click and "Reselect all SmartList Selections" daily)   Per primary  Labs   CBC: Recent Labs  Lab 01/09/22 1105 01/10/22 0026 01/11/22 0353 01/12/22 0351  WBC 12.0* 11.8* 7.1 15.7*  HGB 12.9* 12.4* 12.4* 12.5*  HCT 37.2* 37.6* 36.7* 37.6*  MCV 85.7 89.7 88.6 88.7  PLT 264 266 311 809    Basic Metabolic Panel: Recent Labs  Lab 01/09/22 1105 01/10/22 0026 01/11/22 0353 01/12/22 0351  NA 138 141 138 138  K 4.0 4.0 4.9 4.7  CL 104 110 108 108  CO2 '22 24 22 22  '$ GLUCOSE 133* 68* 288* 205*  BUN 21 20 30* 34*  CREATININE 1.44* 1.38* 1.41* 1.38*  CALCIUM 9.8 8.8* 9.2 9.1   GFR: Estimated Creatinine Clearance: 55.5 mL/min (A) (by C-G formula based on SCr of 1.38 mg/dL (H)). Recent Labs  Lab 01/09/22 1105 01/10/22 0026 01/11/22 0353 01/12/22 0351  PROCALCITON  --  0.43  --   --   WBC 12.0* 11.8* 7.1 15.7*    Liver Function Tests: Recent Labs  Lab 01/10/22 0026 01/11/22 0353 01/12/22 0351  AST 29 17  13*  ALT '31 25 21  '$ ALKPHOS 86 86 79  BILITOT 0.8 0.4 0.4  PROT 7.3 7.1 7.0  ALBUMIN 2.7* 2.4* 2.5*   No results for input(s): "LIPASE", "AMYLASE" in the last 168 hours. No results for input(s): "AMMONIA" in the last 168 hours.  ABG    Component Value Date/Time   TCO2 24 08/18/2015 0912     Coagulation Profile: No results for input(s): "INR", "PROTIME" in the last 168 hours.  Cardiac Enzymes: No results for input(s): "CKTOTAL", "CKMB", "CKMBINDEX", "TROPONINI" in the last 168 hours.  HbA1C: Hgb A1c MFr Bld  Date/Time Value Ref  Range Status  02/17/2017 10:42 AM 9.5 (H) 4.8 - 5.6 % Final    Comment:    (NOTE) Pre diabetes:          5.7%-6.4% Diabetes:              >6.4% Glycemic control for   <7.0% adults with diabetes     CBG: Recent Labs  Lab 01/11/22 0736 01/11/22 1213 01/11/22 1733 01/11/22 2154 01/12/22 0725  GLUCAP 190* 272* 205* 295* 148*    Review of Systems:   Minimal cough, no chest pain or chest discomfort  Past Medical History:  He,  has a past medical history of Arthritis, Diabetes mellitus without complication ([place]), Elevated PSA, Hypertension, Phimosis, Prostate cancer ([place]), Sleep apnea (2004), and Wears glasses.   Surgical History:   Past Surgical History:  Procedure Laterality Date   CIRCUMCISION N/A 08/18/2015   Procedure: CIRCUMCISION WITH BIOPSY OF PENILE NEVUS ;  Surgeon: Irine Seal, MD;  Location: [facility];  Service: Urology;  Laterality: N/A;   CYST EXCISION Right    wrist   LUMBAR LAMINECTOMY/DECOMPRESSION MICRODISCECTOMY N/A 02/22/2017   Procedure: Decompression L3-L4, L-4-L5;  Surgeon: Latanya Maudlin, MD;  Location: [facility];  Service: Orthopedics;  Laterality: N/A;   PROSTATE BIOPSY N/A 08/18/2015   Procedure: PROSTATE ULTRASOUND AND BIOPSY;  Surgeon: Irine Seal, MD;  Location: [facility];  Service: Urology;  Laterality: N/A;     Social History:   reports that he quit smoking about 7 years ago. His smoking use included cigarettes. He has a 22.00 pack-year smoking history. He has never used smokeless tobacco. He reports that he does not currently use alcohol. He reports that he does not use drugs.   Family History:  His family history is negative for Cancer, Breast cancer, Prostate cancer, Pancreatic cancer, and Colon cancer.   Allergies Allergies  Allergen Reactions   Semaglutide     Sherrilyn Rist, MD [facility] Pager: See Shea Evans

## 2022-01-13 ENCOUNTER — Encounter: Payer: Self-pay | Admitting: Internal Medicine

## 2022-01-13 DIAGNOSIS — U071 COVID-19: Secondary | ICD-10-CM | POA: Diagnosis not present

## 2022-01-13 DIAGNOSIS — J9601 Acute respiratory failure with hypoxia: Secondary | ICD-10-CM | POA: Diagnosis not present

## 2022-01-13 DIAGNOSIS — J189 Pneumonia, unspecified organism: Secondary | ICD-10-CM | POA: Diagnosis not present

## 2022-01-13 LAB — CBC WITH DIFFERENTIAL/PLATELET
Abs Immature Granulocytes: 0.19 10*3/uL — ABNORMAL HIGH (ref 0.00–0.07)
Basophils Absolute: 0 10*3/uL (ref 0.0–0.1)
Basophils Relative: 0 %
Eosinophils Absolute: 0 10*3/uL (ref 0.0–0.5)
Eosinophils Relative: 0 %
HCT: 34.9 % — ABNORMAL LOW (ref 39.0–52.0)
Hemoglobin: 11.6 g/dL — ABNORMAL LOW (ref 13.0–17.0)
Immature Granulocytes: 2 %
Lymphocytes Relative: 9 %
Lymphs Abs: 1 10*3/uL (ref 0.7–4.0)
MCH: 29.6 pg (ref 26.0–34.0)
MCHC: 33.2 g/dL (ref 30.0–36.0)
MCV: 89 fL (ref 80.0–100.0)
Monocytes Absolute: 1 10*3/uL (ref 0.1–1.0)
Monocytes Relative: 8 %
Neutro Abs: 9.7 10*3/uL — ABNORMAL HIGH (ref 1.7–7.7)
Neutrophils Relative %: 81 %
Platelets: 343 10*3/uL (ref 150–400)
RBC: 3.92 MIL/uL — ABNORMAL LOW (ref 4.22–5.81)
RDW: 12.1 % (ref 11.5–15.5)
WBC: 12 10*3/uL — ABNORMAL HIGH (ref 4.0–10.5)
nRBC: 0 % (ref 0.0–0.2)

## 2022-01-13 LAB — D-DIMER, QUANTITATIVE: D-Dimer, Quant: 1.43 ug/mL-FEU — ABNORMAL HIGH (ref 0.00–0.50)

## 2022-01-13 LAB — COMPREHENSIVE METABOLIC PANEL
ALT: 19 U/L (ref 0–44)
AST: 12 U/L — ABNORMAL LOW (ref 15–41)
Albumin: 2.3 g/dL — ABNORMAL LOW (ref 3.5–5.0)
Alkaline Phosphatase: 72 U/L (ref 38–126)
Anion gap: 5 (ref 5–15)
BUN: 33 mg/dL — ABNORMAL HIGH (ref 8–23)
CO2: 22 mmol/L (ref 22–32)
Calcium: 8.7 mg/dL — ABNORMAL LOW (ref 8.9–10.3)
Chloride: 107 mmol/L (ref 98–111)
Creatinine, Ser: 1.22 mg/dL (ref 0.61–1.24)
GFR, Estimated: >60 mL/min (ref >60)
Glucose, Bld: 256 mg/dL — ABNORMAL HIGH (ref 70–99)
Potassium: 4.4 mmol/L (ref 3.5–5.1)
Sodium: 134 mmol/L — ABNORMAL LOW (ref 135–145)
Total Bilirubin: 0.3 mg/dL (ref 0.3–1.2)
Total Protein: 6.4 g/dL — ABNORMAL LOW (ref 6.5–8.1)

## 2022-01-13 LAB — MAGNESIUM: Magnesium: 1.9 mg/dL (ref 1.7–2.4)

## 2022-01-13 LAB — C-REACTIVE PROTEIN: CRP: 7.8 mg/dL — ABNORMAL HIGH (ref <1.0)

## 2022-01-13 LAB — GLUCOSE, CAPILLARY
Glucose-Capillary: 180 mg/dL — ABNORMAL HIGH (ref 70–99)
Glucose-Capillary: 257 mg/dL — ABNORMAL HIGH (ref 70–99)
Glucose-Capillary: 370 mg/dL — ABNORMAL HIGH (ref 70–99)

## 2022-01-13 MED ORDER — ALBUTEROL SULFATE HFA 108 (90 BASE) MCG/ACT IN AERS: 1.0000 | INHALATION_SPRAY | RESPIRATORY_TRACT | Status: AC | PRN

## 2022-01-13 MED ORDER — DEXAMETHASONE 6 MG PO TABS: 6.0000 mg | ORAL_TABLET | Freq: Every day | ORAL | 0 refills | Status: AC

## 2022-01-13 NOTE — Discharge Summary (Signed)
Physician Discharge Summary   BUREL KAHRE DJS:[id number] DOB: 1950/06/14 DOA: 01/09/2022  PCP: Lujean Amel, MD  Admit date: 01/09/2022 Discharge date:  01/13/2022 Barriers to discharge: none  Admitted From: Home Disposition:  Home Discharging physician: Dwyane Dee, MD  Recommendations for Outpatient Follow-up:  Continue routine outpatient management  Home Health:  Equipment/Devices:   Discharge Condition: stable CODE STATUS: Full Diet recommendation:  Diet Orders (From admission, onward)     Start     Ordered   01/13/22 0000  Diet Carb Modified        01/13/22 1202   01/09/22 1834  Diet Carb Modified Fluid consistency: Thin; Room service appropriate? Yes  Diet effective now       Question Answer Comment  Diet-HS Snack? Nothing   Calorie Level Medium 1600-2000   Fluid consistency: Thin   Room service appropriate? Yes      01/09/22 1834            Hospital Course: Mr. Neyland is a 71 yo male with PMH prostate cancer, DM II, HTN who presented with worsening shortness of breath, cough.  He was recently diagnosed outpatient with COVID and completed outpatient antiviral course.  Due to worsening shortness of breath, he presented for further evaluation. He was found to be hypoxic requiring 4 L oxygen initially.  Imaging was concerning for underlying superimposed bacterial pneumonia and he was started on antibiotics on admission as well.  Assessment and Plan:  Acute hypoxic respiratory failure: Not on oxygen at home.  No history of previous lung disease.  Presented with shortness of breath, cough.  Required 4 L of oxygen on presentation to maintain saturation. Chest imaging showed action of bilateral multifocal infiltrates suggesting viral versus bacterial pneumonia. -Completed antibiotic course during hospitalization Patient was diagnosed with COVID about 6 days PTA. Repeat test came out to be positive.  Patient states he took oral medication for 5 days as an  outpatient Continue airborne precaution. -Continue weaning oxygen; successfully weaned to room air prior to discharge - Continue encouraging incentive spirometer use -Patient ambulated in hall and passed walk test with no hypoxia warranting home oxygen   COVID-pneumonia/bilateral multifocal pneumonia: Could be viral but antibiotics started to cover for bacterial pneumonia.  Procalcitonin elevated -Completed antibiotics during hospitalization - Discharged with Decadron to complete course   Diabetes type 2: Home regimen resumed at discharge   Hypertension: Continue amlodipine, irbesartan.    CKD stage II: Baseline creatinine ranged from 1.3-1.4.  Currently kidney function at baseline.   Weakness: Most likely secondary to COVID illness.   Lives with wife.  No problem with ambulation when healthy.  Ambulated well with physical therapy with no home needs    The patient's chronic medical conditions were treated accordingly per the patient's home medication regimen except as noted.  On day of discharge, patient was felt deemed stable for discharge. Patient/family member advised to call PCP or come back to ER if needed.   Principal Diagnosis: Acute respiratory failure with hypoxia [facility])  Discharge Diagnoses: Active Hospital Problems   Diagnosis Date Noted   Acute respiratory failure with hypoxia ([place]) 01/09/2022   CAP (community acquired pneumonia) 01/09/2022   COVID-19 virus infection 01/09/2022   HTN (hypertension) 01/09/2022   DM (diabetes mellitus) ([place]) 01/09/2022    Resolved Hospital Problems  No resolved problems to display.     Discharge Instructions     Diet Carb Modified   Complete by: As directed    Increase activity slowly  Complete by: As directed       Allergies as of 01/13/2022       Reactions   Semaglutide         Medication List     STOP taking these medications    alfuzosin 10 MG 24 hr tablet Commonly known as: Uroxatral       TAKE these  medications    albuterol 108 (90 Base) MCG/ACT inhaler Commonly known as: VENTOLIN HFA Inhale 1-2 puffs into the lungs every 4 (four) hours as needed for wheezing or shortness of breath.   amLODipine 5 MG tablet Commonly known as: NORVASC Take 5 mg by mouth daily.   aspirin EC 81 MG tablet Take 81 mg by mouth daily.   D 5000 125 MCG (5000 UT) capsule Generic drug: Cholecalciferol Take 5,000 Units by mouth daily.   dexamethasone 6 MG tablet Commonly known as: DECADRON Take 1 tablet (6 mg total) by mouth daily for 5 days. Start taking on: January 14, 2022   glucose blood test strip 1 each by Other route daily.   Insulin Pen Needle 31G X 5 MM Misc Inject 1 each into the skin daily.   Jardiance 25 MG Tabs tablet Generic drug: empagliflozin Take 25 mg by mouth daily.   Lagevrio 200 MG Caps capsule Generic drug: molnupiravir EUA Take 1 capsule by mouth 2 (two) times daily.   Lantus SoloStar 100 UNIT/ML Solostar Pen Generic drug: insulin glargine Inject 32 Units into the skin daily.   metFORMIN 500 MG tablet Commonly known as: GLUCOPHAGE Take 1,000 mg by mouth daily with breakfast.   olmesartan 40 MG tablet Commonly known as: BENICAR Take 40 mg by mouth daily.   Victoza 18 MG/3ML Sopn Generic drug: liraglutide Inject 1.2 mg into the skin daily.        Allergies  Allergen Reactions   Semaglutide     Consultations: Pulmonology   Procedures:   Discharge Exam: BP (!) 146/75 (BP Location: Right Arm)   Pulse 88   Temp 97.8 F (36.6 C) (Oral)   Resp 17   Ht 6' (1.829 m)   Wt 82.6 kg   SpO2 96%   BMI 24.70 kg/m  Physical Exam Constitutional:      General: He is not in acute distress.    Appearance: Normal appearance.  HENT:     Head: Normocephalic and atraumatic.     Mouth/Throat:     Mouth: Mucous membranes are moist.  Eyes:     Extraocular Movements: Extraocular movements intact.  Cardiovascular:     Rate and Rhythm: Normal rate and regular  rhythm.     Heart sounds: Normal heart sounds.  Pulmonary:     Effort: Pulmonary effort is normal. No respiratory distress.     Breath sounds: Rales present. No wheezing.  Abdominal:     General: Bowel sounds are normal. There is no distension.     Palpations: Abdomen is soft.     Tenderness: There is no abdominal tenderness.  Musculoskeletal:        General: Normal range of motion.     Cervical back: Normal range of motion and neck supple.  Skin:    General: Skin is warm and dry.  Neurological:     General: No focal deficit present.     Mental Status: He is alert.  Psychiatric:        Mood and Affect: Mood normal.        Behavior: Behavior normal.  The results of significant diagnostics from this hospitalization (including imaging, microbiology, ancillary and laboratory) are listed below for reference.   Microbiology: Recent Results (from the past 240 hour(s))  SARS Coronavirus 2 by RT PCR (hospital order, performed in [facility] hospital lab) *cepheid single result test* Anterior Nasal Swab     Status: Abnormal   Collection Time: 01/10/22  9:19 AM   Specimen: Anterior Nasal Swab  Result Value Ref Range Status   SARS Coronavirus 2 by RT PCR POSITIVE (A) NEGATIVE Final    Comment: (NOTE) SARS-CoV-2 target nucleic acids are DETECTED  SARS-CoV-2 RNA is generally detectable in upper respiratory specimens  during the acute phase of infection.  Positive results are indicative  of the presence of the identified virus, but do not rule out bacterial infection or co-infection with other pathogens not detected by the test.  Clinical correlation with patient history and  other diagnostic information is necessary to determine patient infection status.  The expected result is negative.  Fact Sheet for Patients:   https://www.patel.info/   Fact Sheet for Healthcare Providers:   https://hall.com/    This test is not yet approved or  cleared by the [place] FDA and  has been authorized for detection and/or diagnosis of SARS-CoV-2 by FDA under an Emergency Use Authorization (EUA).  This EUA will remain in effect (meaning this test can be used) for the duration of  the COVID-19 declaration under Section 564(b)(1)  of the Act, 21 U.S.C. section 360-bbb-3(b)(1), unless the authorization is terminated or revoked sooner.   Performed at [facility], [place] [address], [place], [place] [address]      Labs: BNP (last 3 results) Recent Labs    01/10/22 0026  BNP 09.3   Basic Metabolic Panel: Recent Labs  Lab 01/09/22 1105 01/10/22 0026 01/11/22 0353 01/12/22 0351 01/13/22 0409  NA 138 141 138 138 134*  K 4.0 4.0 4.9 4.7 4.4  CL 104 110 108 108 107  CO2 '22 24 22 22 22  '$ GLUCOSE 133* 68* 288* 205* 256*  BUN 21 20 30* 34* 33*  CREATININE 1.44* 1.38* 1.41* 1.38* 1.22  CALCIUM 9.8 8.8* 9.2 9.1 8.7*  MG  --   --   --   --  1.9   Liver Function Tests: Recent Labs  Lab 01/10/22 0026 01/11/22 0353 01/12/22 0351 01/13/22 0409  AST 29 17 13* 12*  ALT '31 25 21 19  '$ ALKPHOS 86 86 79 72  BILITOT 0.8 0.4 0.4 0.3  PROT 7.3 7.1 7.0 6.4*  ALBUMIN 2.7* 2.4* 2.5* 2.3*   No results for input(s): "LIPASE", "AMYLASE" in the last 168 hours. No results for input(s): "AMMONIA" in the last 168 hours. CBC: Recent Labs  Lab 01/09/22 1105 01/10/22 0026 01/11/22 0353 01/12/22 0351 01/13/22 0409  WBC 12.0* 11.8* 7.1 15.7* 12.0*  NEUTROABS  --   --   --   --  9.7*  HGB 12.9* 12.4* 12.4* 12.5* 11.6*  HCT 37.2* 37.6* 36.7* 37.6* 34.9*  MCV 85.7 89.7 88.6 88.7 89.0  PLT 264 266 311 361 343   Cardiac Enzymes: No results for input(s): "CKTOTAL", "CKMB", "CKMBINDEX", "TROPONINI" in the last 168 hours. BNP: Invalid input(s): "POCBNP" CBG: Recent Labs  Lab 01/12/22 1223 01/12/22 1822 01/12/22 2205 01/13/22 0736 01/13/22 1134  GLUCAP 234* 218* 345* 180* 257*   D-Dimer Recent Labs     01/12/22 0351 01/13/22 0409  DDIMER 1.11* 1.43*   Hgb A1c No results for input(s): "HGBA1C"  in the last 72 hours. Lipid Profile No results for input(s): "CHOL", "HDL", "LDLCALC", "TRIG", "CHOLHDL", "LDLDIRECT" in the last 72 hours. Thyroid function studies No results for input(s): "TSH", "T4TOTAL", "T3FREE", "THYROIDAB" in the last 72 hours.  Invalid input(s): "FREET3" Anemia work up No results for input(s): "VITAMINB12", "FOLATE", "FERRITIN", "TIBC", "IRON", "RETICCTPCT" in the last 72 hours. Urinalysis    Component Value Date/Time   COLORURINE YELLOW 06/22/2013 1237   APPEARANCEUR CLOUDY (A) 06/22/2013 1237   LABSPEC 1.023 06/22/2013 1237   PHURINE 5.5 06/22/2013 1237   GLUCOSEU NEGATIVE 06/22/2013 1237   HGBUR SMALL (A) 06/22/2013 1237   BILIRUBINUR NEGATIVE 06/22/2013 1237   KETONESUR NEGATIVE 06/22/2013 1237   PROTEINUR NEGATIVE 06/22/2013 1237   UROBILINOGEN 1.0 06/22/2013 1237   NITRITE POSITIVE (A) 06/22/2013 1237   LEUKOCYTESUR MODERATE (A) 06/22/2013 1237   Sepsis Labs Recent Labs  Lab 01/10/22 0026 01/11/22 0353 01/12/22 0351 01/13/22 0409  WBC 11.8* 7.1 15.7* 12.0*   Microbiology Recent Results (from the past 240 hour(s))  SARS Coronavirus 2 by RT PCR (hospital order, performed in [place] hospital lab) *cepheid single result test* Anterior Nasal Swab     Status: Abnormal   Collection Time: 01/10/22  9:19 AM   Specimen: Anterior Nasal Swab  Result Value Ref Range Status   SARS Coronavirus 2 by RT PCR POSITIVE (A) NEGATIVE Final    Comment: (NOTE) SARS-CoV-2 target nucleic acids are DETECTED  SARS-CoV-2 RNA is generally detectable in upper respiratory specimens  during the acute phase of infection.  Positive results are indicative  of the presence of the identified virus, but do not rule out bacterial infection or co-infection with other pathogens not detected by the test.  Clinical correlation with patient history and  other diagnostic information  is necessary to determine patient infection status.  The expected result is negative.  Fact Sheet for Patients:   https://www.patel.info/   Fact Sheet for Healthcare Providers:   https://hall.com/    This test is not yet approved or cleared by the [place] FDA and  has been authorized for detection and/or diagnosis of SARS-CoV-2 by FDA under an Emergency Use Authorization (EUA).  This EUA will remain in effect (meaning this test can be used) for the duration of  the COVID-19 declaration under Section 564(b)(1)  of the Act, 21 U.S.C. section 360-bbb-3(b)(1), unless the authorization is terminated or revoked sooner.   Performed at [facility], [place] [address] [place], [place] [address]     Procedures/Studies: CT Angio Chest PE W and/or Wo Contrast  Result Date: 01/09/2022 CLINICAL DATA:  Pulmonary embolus suspected EXAM: CT ANGIOGRAPHY CHEST WITH CONTRAST TECHNIQUE: Multidetector CT imaging of the chest was performed using the standard protocol during bolus administration of intravenous contrast. Multiplanar CT image reconstructions and MIPs were obtained to evaluate the vascular anatomy. RADIATION DOSE REDUCTION: This exam was performed according to the departmental dose-optimization program which includes automated exposure control, adjustment of the mA and/or kV according to patient size and/or use of iterative reconstruction technique. CONTRAST:  3m OMNIPAQUE IOHEXOL 350 MG/ML SOLN COMPARISON:  None Available. FINDINGS: Cardiovascular: No evidence of pulmonary embolus. Normal heart size. No pericardial effusion. Normal caliber thoracic aorta with no significant atherosclerotic disease. No coronary artery calcifications. Mediastinum/Nodes: Esophagus and thyroid are unremarkable. Mildly enlarged mediastinal and bilateral hilar lymph nodes, likely reactive, reference right hilar lymph node measuring 1.3 cm in short axis on  series 4, image 69. Lungs/Pleura: Central airways are patent. Diffuse patchy ground-glass  opacities with associated smooth septal thickening. Trace bilateral pleural effusions. Upper Abdomen: Gallstones.  No acute abnormality. Musculoskeletal: No chest wall abnormality. No acute or significant osseous findings. Review of the MIP images confirms the above findings. IMPRESSION: 1. No evidence of pulmonary embolus. 2. Diffuse patchy ground-glass opacities, findings are nonspecific with differential considerations including pulmonary edema or infectious/inflammatory etiology, including COVID-19. 3. Trace bilateral pleural effusions. Electronically Signed   By: Yetta Glassman M.D.   On: 01/09/2022 15:00   DG Chest 2 View  Result Date: 01/09/2022 CLINICAL DATA:  Shortness of breath. Diagnosed with COVID-19 5 days ago. Finished antiviral therapy today. Tachypnea when walking. EXAM: CHEST - 2 VIEW COMPARISON:  01/23/2017 FINDINGS: Normal sized heart. Interval multifocal patchy opacities throughout both lungs. No pleural fluid. Thoracic spine degenerative changes. IMPRESSION: Interval extensive bilateral pneumonia or changes due to COVID-19 pneumonitis. Electronically Signed   By: Claudie Revering M.D.   On: 01/09/2022 11:32     Time coordinating discharge: Over 30 minutes    Dwyane Dee, MD  [facility] 01/13/2022, 4:03 PM

## 2022-01-13 NOTE — Plan of Care (Signed)
  Problem: Activity: Goal: Ability to tolerate increased activity will improve Outcome: Progressing   Problem: Respiratory: Goal: Ability to maintain adequate ventilation will improve Outcome: Progressing Goal: Ability to maintain a clear airway will improve Outcome: Progressing   Problem: Coping: Goal: Ability to adjust to condition or change in health will improve Outcome: Progressing   Problem: Metabolic: Goal: Ability to maintain appropriate glucose levels will improve Outcome: Progressing   Problem: Respiratory: Goal: Complications related to the disease process, condition or treatment will be avoided or minimized Outcome: Progressing

## 2022-01-13 NOTE — Plan of Care (Signed)
  Problem: Activity: Goal: Ability to tolerate increased activity will improve 01/13/2022 1732 by Maryelizabeth Rowan, RN Outcome: Progressing 01/13/2022 1306 by Maryelizabeth Rowan, RN Outcome: Progressing   Problem: Respiratory: Goal: Ability to maintain adequate ventilation will improve 01/13/2022 1732 by Maryelizabeth Rowan, RN Outcome: Progressing 01/13/2022 1306 by Maryelizabeth Rowan, RN Outcome: Progressing Goal: Ability to maintain a clear airway will improve 01/13/2022 1732 by Maryelizabeth Rowan, RN Outcome: Progressing 01/13/2022 1306 by Maryelizabeth Rowan, RN Outcome: Progressing   Problem: Coping: Goal: Ability to adjust to condition or change in health will improve 01/13/2022 1732 by Maryelizabeth Rowan, RN Outcome: Progressing 01/13/2022 1306 by Maryelizabeth Rowan, RN Outcome: Progressing   Problem: Metabolic: Goal: Ability to maintain appropriate glucose levels will improve 01/13/2022 1732 by Maryelizabeth Rowan, RN Outcome: Progressing 01/13/2022 1306 by Maryelizabeth Rowan, RN Outcome: Progressing   Problem: Respiratory: Goal: Complications related to the disease process, condition or treatment will be avoided or minimized 01/13/2022 1732 by Maryelizabeth Rowan, RN Outcome: Progressing 01/13/2022 1306 by Maryelizabeth Rowan, RN Outcome: Progressing

## 2022-01-20 ENCOUNTER — Other Ambulatory Visit: Payer: Self-pay | Admitting: Family Medicine

## 2022-01-20 ENCOUNTER — Ambulatory Visit
Admission: RE | Admit: 2022-01-20 | Discharge: 2022-01-20 | Disposition: A | Discharge status: Home / Self Care | Payer: Medicare Other | Source: Ambulatory Visit | Attending: Family Medicine | Admitting: Family Medicine

## 2022-01-20 DIAGNOSIS — J1282 Pneumonia due to coronavirus disease 2019: Secondary | ICD-10-CM

## 2022-02-07 ENCOUNTER — Ambulatory Visit
Admission: RE | Admit: 2022-02-07 | Discharge: 2022-02-07 | Disposition: A | Discharge status: Home / Self Care | Payer: Medicare Other | Source: Ambulatory Visit | Attending: Family Medicine | Admitting: Family Medicine

## 2022-02-07 ENCOUNTER — Other Ambulatory Visit: Payer: Self-pay | Admitting: Family Medicine

## 2022-02-07 DIAGNOSIS — R059 Cough, unspecified: Secondary | ICD-10-CM

## 2022-03-16 ENCOUNTER — Ambulatory Visit (INDEPENDENT_AMBULATORY_CARE_PROVIDER_SITE_OTHER): Payer: Medicare Other | Admitting: Pulmonary Disease

## 2022-03-16 ENCOUNTER — Encounter: Payer: Self-pay | Admitting: Pulmonary Disease

## 2022-03-16 VITALS — BP 108/70 | HR 79 | Ht 72.5 in | Wt 193.0 lb

## 2022-03-16 DIAGNOSIS — J189 Pneumonia, unspecified organism: Secondary | ICD-10-CM

## 2022-03-16 DIAGNOSIS — R0609 Other forms of dyspnea: Secondary | ICD-10-CM

## 2022-03-16 DIAGNOSIS — U071 COVID-19: Secondary | ICD-10-CM | POA: Diagnosis not present

## 2022-03-16 NOTE — Progress Notes (Signed)
Phillip Holloway    [id number]    1950/05/19  Primary Care Physician:Koirala, Dibas, MD  Referring Physician: Lujean Amel, MD [address],  [place] [id number]  Chief complaint:   Patient being seen for shortness of breath  HPI:  Was recently hospitalized for COVID pneumonia, with acute hypoxemic respiratory failure His most recent CT scan of the chest on 01/09/2022 was reviewed in the office today showing extensive infiltrative process  He still got does get short of breath with activity  He does have a sensation of something in the back of his throat that he is able to clear up with coughing, not feeling acutely ill, no fevers, no persistent cough no swallowing difficulty   Reformed smoker quit about 5 years ago  Did not have any known underlying lung disease prior to quitting  Has been functioning relatively well apart from still feeling a little short of breath  Past history of sleep apnea, he had a recent study within the last year that did not show significant sleep apnea any longer, still does snore  Outpatient Encounter Medications as of 03/16/2022  Medication Sig   albuterol (VENTOLIN HFA) 108 (90 Base) MCG/ACT inhaler Inhale 1-2 puffs into the lungs every 4 (four) hours as needed for wheezing or shortness of breath.   amLODipine (NORVASC) 5 MG tablet Take 5 mg by mouth daily.   aspirin EC 81 MG tablet Take 81 mg by mouth daily.   Cholecalciferol (D 5000) 5000 units capsule Take 5,000 Units by mouth daily.   glucose blood test strip 1 each by Other route daily.   Insulin Glargine (LANTUS SOLOSTAR) 100 UNIT/ML Solostar Pen Inject 32 Units into the skin daily.   Insulin Pen Needle 31G X 5 MM MISC Inject 1 each into the skin daily.   JARDIANCE 25 MG TABS tablet Take 25 mg by mouth daily.   LAGEVRIO 200 MG CAPS capsule Take 1 capsule by mouth 2 (two) times daily.   metFORMIN (GLUCOPHAGE) 500 MG tablet Take 1,000 mg by mouth daily  with breakfast.   olmesartan (BENICAR) 40 MG tablet Take 40 mg by mouth daily.   VICTOZA 18 MG/3ML SOPN Inject 1.2 mg into the skin daily.   No facility-administered encounter medications on file as of 03/16/2022.    Allergies as of 03/16/2022 - Review Complete 03/16/2022  Allergen Reaction Noted   Semaglutide  01/09/2022    Past Medical History:  Diagnosis Date   Arthritis    Diabetes mellitus without complication ([facility])    type 2   Elevated PSA    Hypertension    Phimosis    Prostate cancer ([facility])    Sleep apnea 2004   pt states he has been dx'd w [facility]sa, but didn't follow- up  no mask pt. lost weight   Wears glasses     Past Surgical History:  Procedure Laterality Date   CIRCUMCISION N/A 08/18/2015   Procedure: CIRCUMCISION WITH BIOPSY OF PENILE NEVUS ;  Surgeon: Irine Seal, MD;  Location: [facility];  Service: Urology;  Laterality: N/A;   CYST EXCISION Right    wrist   LUMBAR LAMINECTOMY/DECOMPRESSION MICRODISCECTOMY N/A 02/22/2017   Procedure: Decompression L3-L4, L-4-L5;  Surgeon: Latanya Maudlin, MD;  Location: [facility];  Service: Orthopedics;  Laterality: N/A;   PROSTATE BIOPSY N/A 08/18/2015   Procedure: PROSTATE ULTRASOUND AND BIOPSY;  Surgeon: Irine Seal, MD;  Location: [facility];  Service:  Urology;  Laterality: N/A;    Family History  Problem Relation Age of Onset   Cancer Neg Hx    Breast cancer Neg Hx    Prostate cancer Neg Hx    Pancreatic cancer Neg Hx    Colon cancer Neg Hx     Social History   Socioeconomic History   Marital status: Married    Spouse name: Not on file   Number of children: 2   Years of education: Not on file   Highest education level: Not on file  Occupational History   Not on file  Tobacco Use   Smoking status: Former    Packs/day: 0.50    Years: 44.00    Total pack years: 22.00    Types: Cigarettes    Quit date: 08/17/2014    Years since quitting: 7.5   Smokeless tobacco: Never   Tobacco  comments:    smokes occasional vapors  Vaping Use   Vaping Use: Every day  Substance and Sexual Activity   Alcohol use: Not Currently    Comment: 1/q 2 months; liquor   Drug use: No   Sexual activity: Yes  Other Topics Concern   Not on file  Social History Narrative   Not on file   Social Determinants of Health   Financial Resource Strain: Not on file  Food Insecurity: Not on file  Transportation Needs: Not on file  Physical Activity: Not on file  Stress: Not on file  Social Connections: Not on file  Intimate Partner Violence: Not on file    Review of Systems  Constitutional:  Negative for fatigue.  Respiratory:  Positive for shortness of breath.     Vitals:   03/16/22 0927  BP: 108/70  Pulse: 79  SpO2: 100%     Physical Exam Constitutional:      Appearance: Normal appearance.  HENT:     Head: Normocephalic.     Mouth/Throat:     Mouth: Mucous membranes are moist.  Cardiovascular:     Rate and Rhythm: Normal rate and regular rhythm.     Heart sounds: No murmur heard. Pulmonary:     Effort: No respiratory distress.     Breath sounds: No stridor. No wheezing or rhonchi.  Musculoskeletal:     Cervical back: No rigidity or tenderness.  Neurological:     Mental Status: He is alert.  Psychiatric:        Mood and Affect: Mood normal.      Data Reviewed: CT scan of the chest was reviewed with the patient  Assessment:  Shortness of breath related to recent COVID-pneumonia  Past history of smoking  Shortness of breath on exertion  Intermittent cough    Plan/Recommendations: We will repeat a CT scan of the chest after about 3 months from a previous to follow-up on infiltrative process which was very extensive  Obtain a pulmonary function test  Graded exercises as tolerated  Patient had questions about being able to return back to work, I believe he should try and challenge himself in the next couple of weeks to see what he is able to do as he  does not have work accommodations with reduced activity tolerance   Follow-up in about 8 weeks  Encouraged to call with significant concerns   Sherrilyn Rist MD [facility] 03/16/2022, 10:19 AM  CC: Lujean Amel, MD

## 2022-03-16 NOTE — Addendum Note (Signed)
Addended by: Dessie Coma on: 03/16/2022 10:35 AM   Modules accepted: Orders

## 2022-03-16 NOTE — Patient Instructions (Signed)
Recent COVID-pneumonia Shortness of breath with activity  Graded exercises as tolerated  Try and challenge yourself with activities as tolerated-walking, exercise bike  You should be able to return back to work if you are able to tolerate activities in the next 2 to 3 weeks  We will repeat your CT chest to assess for resolution of extensive changes  We will get a breathing study  I will see you back in about 8 weeks  Call with significant concerns

## 2022-04-15 ENCOUNTER — Ambulatory Visit (HOSPITAL_COMMUNITY): Admission: RE | Admit: 2022-04-15 | Payer: Medicare Other | Source: Ambulatory Visit

## 2022-06-24 ENCOUNTER — Other Ambulatory Visit (HOSPITAL_COMMUNITY): Payer: Self-pay | Admitting: Family Medicine

## 2022-06-24 DIAGNOSIS — R1030 Lower abdominal pain, unspecified: Secondary | ICD-10-CM

## 2022-06-25 ENCOUNTER — Ambulatory Visit (HOSPITAL_BASED_OUTPATIENT_CLINIC_OR_DEPARTMENT_OTHER)
Admission: RE | Admit: 2022-06-25 | Discharge: 2022-06-25 | Disposition: A | Discharge status: Home / Self Care | Payer: Medicare Other | Source: Ambulatory Visit | Attending: Family Medicine | Admitting: Family Medicine

## 2022-06-25 ENCOUNTER — Encounter (HOSPITAL_BASED_OUTPATIENT_CLINIC_OR_DEPARTMENT_OTHER): Payer: Self-pay

## 2022-06-25 DIAGNOSIS — R1030 Lower abdominal pain, unspecified: Secondary | ICD-10-CM

## 2022-06-25 LAB — POCT I-STAT CREATININE: Creatinine, Ser: 1.5 mg/dL — ABNORMAL HIGH (ref 0.61–1.24)

## 2022-06-25 MED ORDER — IOHEXOL 300 MG/ML  SOLN
80.0000 mL | Freq: Once | INTRAMUSCULAR | Status: AC | PRN
  Administered 2022-06-25: 80 mL via INTRAVENOUS

## 2022-09-26 ENCOUNTER — Ambulatory Visit: Payer: Medicare Other | Admitting: Pulmonary Disease

## 2022-10-04 ENCOUNTER — Ambulatory Visit: Payer: Medicare Other | Admitting: Pulmonary Disease

## 2022-10-04 ENCOUNTER — Encounter: Payer: Self-pay | Admitting: Pulmonary Disease

## 2022-10-04 ENCOUNTER — Ambulatory Visit (INDEPENDENT_AMBULATORY_CARE_PROVIDER_SITE_OTHER): Payer: Medicare Other | Admitting: Pulmonary Disease

## 2022-10-04 VITALS — BP 140/82 | HR 80 | Ht 73.0 in | Wt 199.8 lb

## 2022-10-04 DIAGNOSIS — R0609 Other forms of dyspnea: Secondary | ICD-10-CM | POA: Diagnosis not present

## 2022-10-04 DIAGNOSIS — U071 COVID-19: Secondary | ICD-10-CM

## 2022-10-04 LAB — PULMONARY FUNCTION TEST
DL/VA % pred: 109 %
DL/VA: 4.38 ml/min/mmHg/L
DLCO cor % pred: 76 %
DLCO cor: 21.92 ml/min/mmHg
DLCO unc % pred: 76 %
DLCO unc: 21.92 ml/min/mmHg
FEF 25-75 Post: 1.28 L/s
FEF 25-75 Pre: 2.95 L/s
FEF2575-%Change-Post: -56 %
FEF2575-%Pred-Post: 45 %
FEF2575-%Pred-Pre: 105 %
FEV1-%Change-Post: -30 %
FEV1-%Pred-Post: 54 %
FEV1-%Pred-Pre: 78 %
FEV1-Post: 2.01 L
FEV1-Pre: 2.88 L
FEV1FVC-%Change-Post: -33 %
FEV1FVC-%Pred-Pre: 113 %
FEV6-%Change-Post: 4 %
FEV6-%Pred-Post: 76 %
FEV6-%Pred-Pre: 72 %
FEV6-Post: 3.6 L
FEV6-Pre: 3.43 L
FEV6FVC-%Pred-Post: 105 %
FEV6FVC-%Pred-Pre: 105 %
FVC-%Change-Post: 4 %
FVC-%Pred-Post: 72 %
FVC-%Pred-Pre: 68 %
FVC-Post: 3.6 L
FVC-Pre: 3.43 L
Post FEV1/FVC ratio: 56 %
Post FEV6/FVC ratio: 100 %
Pre FEV1/FVC ratio: 84 %
Pre FEV6/FVC Ratio: 100 %
RV % pred: 82 %
RV: 2.14 L
TLC % pred: 74 %
TLC: 5.68 L

## 2022-10-04 NOTE — Patient Instructions (Signed)
Full PFT performed today. °

## 2022-10-04 NOTE — Progress Notes (Signed)
Full PFT performed today. °

## 2022-10-04 NOTE — Progress Notes (Signed)
Phillip Holloway    [id number]    11-Mar-1951  Primary Care Physician:Koirala, Dibas, MD  Referring Physician: Darrow Bussing, MD [address] [place],  [place] [address]  Chief complaint:   Shortness of breath is better  HPI:  He is feeling better  Was initially seen for COVID-pneumonia with hypoxemic respiratory failure with extensive infiltrative process in the lungs  Shortness of breath continues to improve  Denies any chest pains or chest discomfort at present   Reformed smoker quit about 5 years ago  Did not have any known underlying lung disease prior to quitting  Has been functioning relatively well apart from still feeling a little short of breath  Past history of sleep apnea, he had a recent study within the last year that did not show significant sleep apnea any longer, still does snore  Outpatient Encounter Medications as of 10/04/2022  Medication Sig   amLODipine (NORVASC) 5 MG tablet Take 5 mg by mouth daily.   aspirin EC 81 MG tablet Take 81 mg by mouth daily.   Cholecalciferol (D 5000) 5000 units capsule Take 5,000 Units by mouth daily.   glucose blood test strip 1 each by Other route daily.   Insulin Glargine (LANTUS SOLOSTAR) 100 UNIT/ML Solostar Pen Inject 32 Units into the skin daily.   Insulin Pen Needle 31G X 5 MM MISC Inject 1 each into the skin daily.   JARDIANCE 25 MG TABS tablet Take 25 mg by mouth daily.   metFORMIN (GLUCOPHAGE) 500 MG tablet Take 1,000 mg by mouth daily with breakfast.   olmesartan (BENICAR) 40 MG tablet Take 40 mg by mouth daily.   [DISCONTINUED] LAGEVRIO 200 MG CAPS capsule Take 1 capsule by mouth 2 (two) times daily.   albuterol (VENTOLIN HFA) 108 (90 Base) MCG/ACT inhaler Inhale 1-2 puffs into the lungs every 4 (four) hours as needed for wheezing or shortness of breath. (Patient not taking: Reported on 10/04/2022)   [DISCONTINUED] VICTOZA 18 MG/3ML SOPN Inject 1.2 mg into the skin daily. (Patient  not taking: Reported on 10/04/2022)   No facility-administered encounter medications on file as of 10/04/2022.    Allergies as of 10/04/2022 - Review Complete 10/04/2022  Allergen Reaction Noted   Semaglutide  01/09/2022    Past Medical History:  Diagnosis Date   Arthritis    Diabetes mellitus without complication ([facility])    type 2   Elevated PSA    Hypertension    Phimosis    Prostate cancer ([facility])    Sleep apnea 2004   pt states he has been dx'd w osa, but didn't follow- up  no mask pt. lost weight   Wears glasses     Past Surgical History:  Procedure Laterality Date   CIRCUMCISION N/A 08/18/2015   Procedure: CIRCUMCISION WITH BIOPSY OF PENILE NEVUS ;  Surgeon: Bjorn Pippin, MD;  Location: [facility];  Service: Urology;  Laterality: N/A;   CYST EXCISION Right    wrist   LUMBAR LAMINECTOMY/DECOMPRESSION MICRODISCECTOMY N/A 02/22/2017   Procedure: Decompression L3-L4, L-4-L5;  Surgeon: Ranee Gosselin, MD;  Location: [facility];  Service: Orthopedics;  Laterality: N/A;   PROSTATE BIOPSY N/A 08/18/2015   Procedure: PROSTATE ULTRASOUND AND BIOPSY;  Surgeon: Bjorn Pippin, MD;  Location: [facility];  Service: Urology;  Laterality: N/A;    Family History  Problem Relation Age of Onset   Cancer Neg Hx    Breast cancer Neg Hx  Prostate cancer Neg Hx    Pancreatic cancer Neg Hx    Colon cancer Neg Hx     Social History   Socioeconomic History   Marital status: Married    Spouse name: Not on file   Number of children: 2   Years of education: Not on file   Highest education level: Not on file  Occupational History   Not on file  Tobacco Use   Smoking status: Former    Packs/day: 0.50    Years: 44.00    Additional pack years: 0.00    Total pack years: 22.00    Types: Cigarettes    Quit date: 08/17/2014    Years since quitting: 8.1   Smokeless tobacco: Never   Tobacco comments:    smokes occasional vapors  Vaping Use   Vaping Use: Every day   Substance and Sexual Activity   Alcohol use: Not Currently    Comment: 1/q 2 months; liquor   Drug use: No   Sexual activity: Yes  Other Topics Concern   Not on file  Social History Narrative   Not on file   Social Determinants of Health   Financial Resource Strain: Not on file  Food Insecurity: Not on file  Transportation Needs: Not on file  Physical Activity: Not on file  Stress: Not on file  Social Connections: Not on file  Intimate Partner Violence: Not on file    Review of Systems  Constitutional:  Negative for fatigue.  Respiratory:  Positive for shortness of breath.     Vitals:   10/04/22 1106 10/04/22 1111  BP: (!) 150/82 (!) 140/82  Pulse: 80   SpO2: 97%      Physical Exam Constitutional:      Appearance: Normal appearance.  HENT:     Head: Normocephalic.     Mouth/Throat:     Mouth: Mucous membranes are moist.  Eyes:     General: No scleral icterus.    Pupils: Pupils are equal, round, and reactive to light.  Cardiovascular:     Rate and Rhythm: Normal rate and regular rhythm.     Heart sounds: No murmur heard. Pulmonary:     Effort: No respiratory distress.     Breath sounds: No stridor. No wheezing, rhonchi or rales.  Musculoskeletal:     Cervical back: No rigidity or tenderness.  Neurological:     Mental Status: He is alert.  Psychiatric:        Mood and Affect: Mood normal.      Data Reviewed: CT scan of the chest was reviewed with the patient -Extensive infiltrate  Pulmonary function test today 5/21 showing restrictive physiology  Assessment:  Shortness of breath related to recent COVID-pneumonia  Abnormal CT scan of the chest showing extensive infiltrative process  Abnormal PFT showing restrictive lung disease  Past history of smoking  Shortness of breath on exertion   Plan/Recommendations: Obtain CT scan of the chest to follow infiltrative process to resolution  Encourage graded activities as tolerated  I will see  him back in about 4 months  Encouraged to call with significant concerns  Virl Diamond MD [facility] 10/04/2022, 11:27 AM  CC: Darrow Bussing, MD

## 2022-10-04 NOTE — Patient Instructions (Signed)
I will see you back in about 4 months  Schedule for CT scan of the chest without contrast to follow-up on extensive infiltrative process during recent COVID infection  Continue graded activities as tolerated  Your breathing study does show a little reduction in lung capacity-I think this is just related to the infection you had previously  Call us with significant concerns

## 2022-10-14 ENCOUNTER — Ambulatory Visit (HOSPITAL_COMMUNITY): Payer: Medicare Other

## 2022-10-29 ENCOUNTER — Ambulatory Visit (HOSPITAL_BASED_OUTPATIENT_CLINIC_OR_DEPARTMENT_OTHER)
Admission: RE | Admit: 2022-10-29 | Discharge: 2022-10-29 | Disposition: A | Discharge status: Home / Self Care | Payer: Medicare Other | Source: Ambulatory Visit | Attending: Pulmonary Disease | Admitting: Pulmonary Disease

## 2022-10-29 DIAGNOSIS — U071 COVID-19: Secondary | ICD-10-CM

## 2022-10-29 DIAGNOSIS — K8020 Calculus of gallbladder without cholecystitis without obstruction: Secondary | ICD-10-CM | POA: Diagnosis not present

## 2022-10-29 DIAGNOSIS — I700 Atherosclerosis of aorta: Secondary | ICD-10-CM | POA: Diagnosis not present

## 2022-10-29 DIAGNOSIS — J849 Interstitial pulmonary disease, unspecified: Secondary | ICD-10-CM | POA: Diagnosis not present

## 2022-11-11 ENCOUNTER — Ambulatory Visit: Payer: Medicare Other | Admitting: Podiatry

## 2022-12-28 ENCOUNTER — Encounter: Payer: Self-pay | Admitting: Podiatry

## 2022-12-28 ENCOUNTER — Ambulatory Visit: Payer: Medicare Other | Admitting: Podiatry

## 2022-12-28 DIAGNOSIS — Z794 Long term (current) use of insulin: Secondary | ICD-10-CM

## 2022-12-28 DIAGNOSIS — E119 Type 2 diabetes mellitus without complications: Secondary | ICD-10-CM

## 2022-12-28 DIAGNOSIS — B351 Tinea unguium: Secondary | ICD-10-CM

## 2022-12-28 DIAGNOSIS — M79674 Pain in right toe(s): Secondary | ICD-10-CM | POA: Diagnosis not present

## 2022-12-28 NOTE — Progress Notes (Signed)
This patient presents to the office with chief complaint of long thick nails and diabetic feet.  This patient  says there  is  no pain and discomfort in their feet.  This patient says there are long thick painful nails.  These nails are painful walking and wearing shoes.  Patient has no history of infection or drainage from both feet.  Patient is unable to  self treat his own nails . This patient presents  to the office today for treatment of the  long nails and a foot evaluation due to history of  diabetes.  General Appearance  Alert, conversant and in no acute stress.  Vascular  Dorsalis pedis and posterior tibial  pulses are palpable  bilaterally.  Capillary return is within normal limits  bilaterally. Temperature is within normal limits  bilaterally.  Neurologic  Senn-Weinstein monofilament wire test within normal limits  bilaterally. Muscle power within normal limits bilaterally.  Nails Thick disfigured discolored nails with subungual debris  hallux right foot. No evidence of bacterial infection or drainage bilaterally. Incurvated nail medial border right hallux.  Orthopedic  No limitations of motion of motion feet .  No crepitus or effusions noted.  No bony pathology or digital deformities noted. HAV  B/L.  Midfoot  DJD.  Skin  normotropic skin with no porokeratosis noted bilaterally.  No signs of infections or ulcers noted.   Asymptomatic skin lesion under medial malleolus left foot.  Onychomycosis  Diabetes with no foot complications  IE  Debride nails right hallux. A diabetic foot exam was performed and there is no evidence of any vascular or neurologic pathology.   RTC 1 year.   Helane Gunther DPM

## 2023-02-25 ENCOUNTER — Encounter (HOSPITAL_COMMUNITY): Payer: Self-pay | Admitting: Emergency Medicine

## 2023-02-25 ENCOUNTER — Ambulatory Visit (HOSPITAL_COMMUNITY)
Admission: EM | Admit: 2023-02-25 | Discharge: 2023-02-25 | Disposition: A | Discharge status: Home / Self Care | Payer: Worker's Compensation

## 2023-02-25 DIAGNOSIS — M545 Low back pain, unspecified: Secondary | ICD-10-CM | POA: Diagnosis not present

## 2023-02-25 MED ORDER — BACLOFEN 10 MG PO TABS: 10.0000 mg | ORAL_TABLET | Freq: Three times a day (TID) | ORAL | 0 refills | Status: AC
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
## 2023-12-29 ENCOUNTER — Ambulatory Visit: Payer: Medicare Other | Admitting: Podiatry

## 2024-04-10 NOTE — Progress Notes (Addendum)
 Phillip Holloway                                          MRN: [id number]   05/20/2024   The [facility] Quality Team Specialist reviewed this patient medical record for the purposes of chart review for care gap closure. The following were reviewed: chart review for care gap closure-glycemic status assessment.    [facility] Quality Team
